# Patient Record
Sex: Female | Born: 1960 | Race: White | Hispanic: No | Marital: Married | Smoking: Former smoker
Health system: Southern US, Community
[De-identification: ages and names within clinical notes are randomized; demographics above are authoritative.]

## PROBLEM LIST (undated history)

## (undated) DIAGNOSIS — G43909 Migraine, unspecified, not intractable, without status migrainosus: Secondary | ICD-10-CM

## (undated) DIAGNOSIS — Z9889 Other specified postprocedural states: Secondary | ICD-10-CM

## (undated) DIAGNOSIS — E7800 Pure hypercholesterolemia, unspecified: Secondary | ICD-10-CM

## (undated) DIAGNOSIS — IMO0002 Reserved for concepts with insufficient information to code with codable children: Secondary | ICD-10-CM

## (undated) DIAGNOSIS — N6092 Unspecified benign mammary dysplasia of left breast: Secondary | ICD-10-CM

## (undated) DIAGNOSIS — R112 Nausea with vomiting, unspecified: Secondary | ICD-10-CM

## (undated) DIAGNOSIS — Z8619 Personal history of other infectious and parasitic diseases: Secondary | ICD-10-CM

## (undated) DIAGNOSIS — D4860 Neoplasm of uncertain behavior of unspecified breast: Secondary | ICD-10-CM

## (undated) DIAGNOSIS — J45909 Unspecified asthma, uncomplicated: Secondary | ICD-10-CM

## (undated) HISTORY — DX: Migraine, unspecified, not intractable, without status migrainosus: G43.909

## (undated) HISTORY — DX: Personal history of other infectious and parasitic diseases: Z86.19

## (undated) HISTORY — DX: Unspecified asthma, uncomplicated: J45.909

## (undated) HISTORY — DX: Reserved for concepts with insufficient information to code with codable children: IMO0002

## (undated) HISTORY — DX: Neoplasm of uncertain behavior of unspecified breast: D48.60

## (undated) HISTORY — DX: Unspecified benign mammary dysplasia of left breast: N60.92

## (undated) HISTORY — DX: Pure hypercholesterolemia, unspecified: E78.00

---
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
## 1981-07-12 DIAGNOSIS — J45909 Unspecified asthma, uncomplicated: Secondary | ICD-10-CM

## 1981-07-12 HISTORY — DX: Unspecified asthma, uncomplicated: J45.909

## 1997-12-02 ENCOUNTER — Other Ambulatory Visit: Admission: RE | Admit: 1997-12-02 | Discharge: 1997-12-02 | Payer: Self-pay | Admitting: Obstetrics and Gynecology

## 1998-05-26 ENCOUNTER — Inpatient Hospital Stay (HOSPITAL_COMMUNITY): Admission: AD | Admit: 1998-05-26 | Discharge: 1998-05-26 | Payer: Self-pay | Admitting: Obstetrics and Gynecology

## 1998-05-27 ENCOUNTER — Inpatient Hospital Stay (HOSPITAL_COMMUNITY): Admission: AD | Admit: 1998-05-27 | Discharge: 1998-05-30 | Payer: Self-pay | Admitting: Obstetrics and Gynecology

## 1998-06-16 ENCOUNTER — Inpatient Hospital Stay (HOSPITAL_COMMUNITY): Admission: AD | Admit: 1998-06-16 | Discharge: 1998-06-16 | Payer: Self-pay | Admitting: Obstetrics and Gynecology

## 1998-06-24 ENCOUNTER — Inpatient Hospital Stay (HOSPITAL_COMMUNITY): Admission: AD | Admit: 1998-06-24 | Discharge: 1998-06-26 | Payer: Self-pay | Admitting: Obstetrics and Gynecology

## 1998-07-31 ENCOUNTER — Other Ambulatory Visit: Admission: RE | Admit: 1998-07-31 | Discharge: 1998-07-31 | Payer: Self-pay | Admitting: Obstetrics and Gynecology

## 1999-07-13 HISTORY — PX: GANGLION CYST EXCISION: SHX1691

## 2000-01-29 ENCOUNTER — Other Ambulatory Visit: Admission: RE | Admit: 2000-01-29 | Discharge: 2000-01-29 | Payer: Self-pay | Admitting: Obstetrics and Gynecology

## 2001-05-25 ENCOUNTER — Other Ambulatory Visit: Admission: RE | Admit: 2001-05-25 | Discharge: 2001-05-25 | Payer: Self-pay | Admitting: Obstetrics and Gynecology

## 2007-06-19 ENCOUNTER — Ambulatory Visit: Payer: Self-pay | Admitting: Specialist

## 2009-07-12 HISTORY — PX: DILATION AND CURETTAGE OF UTERUS: SHX78

## 2010-01-19 ENCOUNTER — Ambulatory Visit: Payer: Self-pay | Admitting: Unknown Physician Specialty

## 2010-01-20 ENCOUNTER — Ambulatory Visit: Payer: Self-pay | Admitting: Unknown Physician Specialty

## 2010-01-22 LAB — PATHOLOGY REPORT

## 2010-07-12 DIAGNOSIS — N6092 Unspecified benign mammary dysplasia of left breast: Secondary | ICD-10-CM

## 2010-07-12 HISTORY — DX: Unspecified benign mammary dysplasia of left breast: N60.92

## 2010-07-12 HISTORY — PX: BREAST MASS EXCISION: SHX1267

## 2010-07-20 ENCOUNTER — Ambulatory Visit: Payer: Self-pay | Admitting: General Surgery

## 2010-07-21 LAB — PATHOLOGY REPORT

## 2011-02-15 ENCOUNTER — Ambulatory Visit: Payer: Self-pay | Admitting: General Surgery

## 2011-02-18 LAB — PATHOLOGY REPORT

## 2011-07-13 DIAGNOSIS — D4860 Neoplasm of uncertain behavior of unspecified breast: Secondary | ICD-10-CM

## 2011-07-13 HISTORY — DX: Neoplasm of uncertain behavior of unspecified breast: D48.60

## 2012-07-12 HISTORY — PX: COLONOSCOPY: SHX174

## 2012-09-21 ENCOUNTER — Encounter: Payer: Self-pay | Admitting: *Deleted

## 2012-10-10 ENCOUNTER — Encounter: Payer: Self-pay | Admitting: General Surgery

## 2012-12-05 ENCOUNTER — Encounter: Payer: Self-pay | Admitting: General Surgery

## 2012-12-06 ENCOUNTER — Ambulatory Visit (INDEPENDENT_AMBULATORY_CARE_PROVIDER_SITE_OTHER): Payer: BC Managed Care – PPO | Admitting: General Surgery

## 2012-12-06 ENCOUNTER — Other Ambulatory Visit: Payer: Self-pay | Admitting: *Deleted

## 2012-12-06 ENCOUNTER — Encounter: Payer: Self-pay | Admitting: General Surgery

## 2012-12-06 VITALS — BP 120/74 | HR 76 | Resp 14 | Ht 63.0 in | Wt 141.0 lb

## 2012-12-06 DIAGNOSIS — D4860 Neoplasm of uncertain behavior of unspecified breast: Secondary | ICD-10-CM

## 2012-12-06 DIAGNOSIS — Z1231 Encounter for screening mammogram for malignant neoplasm of breast: Secondary | ICD-10-CM

## 2012-12-06 DIAGNOSIS — D4862 Neoplasm of uncertain behavior of left breast: Secondary | ICD-10-CM

## 2012-12-06 DIAGNOSIS — Z803 Family history of malignant neoplasm of breast: Secondary | ICD-10-CM | POA: Insufficient documentation

## 2012-12-06 NOTE — Progress Notes (Signed)
Patient will be asked to return to the office in one year for a bilateral screening mammogram. 

## 2012-12-06 NOTE — Progress Notes (Signed)
Patient ID: Abigail Lopez, female   DOB: 1960-11-10, 52 y.o.   MRN: [id number]  No chief complaint on file.   HPI Abigail Lopez is a 52 y.o. female who presents for a follow up mammogram. The most recent mammogram was done on 11/22/12 with birad category 2. The patient admits regular self breast checks and gets regular mammograms done. The patient's mother had a history of breast cancer diagnosed at the age 57. She has had a left breast mass excision done in 2012 - showing ALH. She states no problems at this time with her breasts. Patient also had a screening colonoscopy at [facility] and 2-3 polyps were removed this is per patient.  HPI  Past Medical History  Diagnosis Date  . Neoplasm of uncertain behavior of breast 2013  . Asthma 1983  . DDD (degenerative disc disease)     Past Surgical History  Procedure Laterality Date  . Dilation and curettage of uterus  2011  . Breast mass excision Left 2012  . Knee surgery Left 1978  . Ganglion cyst excision  2001  . Cesarean section  1995    Family History  Problem Relation Age of Onset  . Cancer Mother 91    breast     Social History History  Substance Use Topics  . Smoking status: Never Smoker   . Smokeless tobacco: Never Used  . Alcohol Use: No    Allergies  Allergen Reactions  . Atarax (Hydroxyzine) Hives and Itching  . Tandearil (Oxyphenbutazone) Hives and Itching    No current outpatient prescriptions on file.   No current facility-administered medications for this visit.    Review of Systems Review of Systems  Constitutional: Negative.   Respiratory: Negative.   Cardiovascular: Negative.     Blood pressure 120/74, pulse 76, resp. rate 14, height 5\' 3"  (1.6 m), weight 141 lb (63.957 kg), last menstrual period 12/04/2012.  Physical Exam Physical Exam  Constitutional: She is oriented to person, place, and time. She appears well-developed and well-nourished.  Eyes: Conjunctivae are normal. No scleral  icterus.  Neck: Trachea normal. No mass and no thyromegaly present.  Cardiovascular: Normal rate, regular rhythm, normal heart sounds and normal pulses.   No murmur heard. Pulses:      Dorsalis pedis pulses are 2+ on the right side, and 2+ on the left side.       Posterior tibial pulses are 2+ on the right side, and 2+ on the left side.  No leg edema or varicose veins.   Pulmonary/Chest: Effort normal and breath sounds normal. Right breast exhibits no inverted nipple, no mass, no nipple discharge, no skin change and no tenderness. Left breast exhibits no inverted nipple, no mass, no nipple discharge, no skin change and no tenderness. Breasts are symmetrical.  Abdominal: Soft. Normal appearance and bowel sounds are normal. There is no hepatosplenomegaly. There is no tenderness. No hernia.  Lymphadenopathy:    She has no cervical adenopathy.    She has no axillary adenopathy.  Neurological: She is alert and oriented to person, place, and time.  Skin: Skin is warm and dry.    Data Reviewed  Mammogram reviewed.   Assessment    Stable Exam.  High risk due to [facility] and family history.     Plan    Yearly follow up. Patient not inclined to have genetic testing at this time.         Abigail Lopez 12/07/2012, 9:38 AM

## 2012-12-06 NOTE — Patient Instructions (Addendum)
Patient to return in 1 year with bilateral screening mammogram. 

## 2012-12-07 ENCOUNTER — Encounter: Payer: Self-pay | Admitting: General Surgery

## 2013-02-07 LAB — HM PAP SMEAR: HM Pap smear: NEGATIVE

## 2013-12-06 ENCOUNTER — Ambulatory Visit: Payer: BC Managed Care – PPO | Admitting: General Surgery

## 2014-03-27 ENCOUNTER — Ambulatory Visit: Payer: BC Managed Care – PPO | Admitting: General Surgery

## 2014-04-25 ENCOUNTER — Other Ambulatory Visit: Payer: Self-pay | Admitting: Physical Medicine and Rehabilitation

## 2014-04-25 DIAGNOSIS — M5010 Cervical disc disorder with radiculopathy, unspecified cervical region: Secondary | ICD-10-CM

## 2014-04-29 ENCOUNTER — Other Ambulatory Visit: Payer: Self-pay | Admitting: Nurse Practitioner

## 2014-04-29 DIAGNOSIS — M542 Cervicalgia: Secondary | ICD-10-CM

## 2014-04-29 DIAGNOSIS — M545 Low back pain, unspecified: Secondary | ICD-10-CM

## 2014-05-04 ENCOUNTER — Ambulatory Visit
Admission: RE | Admit: 2014-05-04 | Discharge: 2014-05-04 | Disposition: A | Discharge status: Home / Self Care | Payer: BC Managed Care – PPO | Source: Ambulatory Visit | Attending: Nurse Practitioner | Admitting: Nurse Practitioner

## 2014-05-04 DIAGNOSIS — M542 Cervicalgia: Secondary | ICD-10-CM

## 2014-05-04 DIAGNOSIS — M545 Low back pain, unspecified: Secondary | ICD-10-CM

## 2014-05-08 ENCOUNTER — Encounter: Payer: Self-pay | Admitting: *Deleted

## 2014-05-13 ENCOUNTER — Encounter: Payer: Self-pay | Admitting: General Surgery

## 2015-01-21 ENCOUNTER — Other Ambulatory Visit: Payer: Self-pay | Admitting: Family Medicine

## 2015-01-21 DIAGNOSIS — Z1231 Encounter for screening mammogram for malignant neoplasm of breast: Secondary | ICD-10-CM

## 2015-01-21 LAB — HM PAP SMEAR: HM Pap smear: NEGATIVE

## 2015-02-12 ENCOUNTER — Encounter: Payer: Self-pay | Admitting: General Surgery

## 2015-02-12 ENCOUNTER — Ambulatory Visit (INDEPENDENT_AMBULATORY_CARE_PROVIDER_SITE_OTHER): Payer: BLUE CROSS/BLUE SHIELD | Admitting: General Surgery

## 2015-02-12 VITALS — BP 112/72 | HR 70 | Resp 12 | Ht 63.0 in | Wt 142.0 lb

## 2015-02-12 DIAGNOSIS — N644 Mastodynia: Secondary | ICD-10-CM | POA: Diagnosis not present

## 2015-02-12 DIAGNOSIS — Z803 Family history of malignant neoplasm of breast: Secondary | ICD-10-CM

## 2015-02-12 DIAGNOSIS — N62 Hypertrophy of breast: Secondary | ICD-10-CM

## 2015-02-12 DIAGNOSIS — N6092 Unspecified benign mammary dysplasia of left breast: Secondary | ICD-10-CM

## 2015-02-12 NOTE — Progress Notes (Signed)
Patient ID: Abigail Lopez, female   DOB: 09/18/1960, 54 y.o.   MRN: [id number]  Chief Complaint  Patient presents with  . Other    mammogram    HPI Abigail Lopez is a 54 y.o. female who presents for a breast evaluation. The most recent mammogram was done on 02/05/15. She states she was having left breast pain, manifested by tenderness with palpation which has now resolved.  Patient was on birth control pills for menstrual irregularity at the time her breast pain developed and as soon as she stop taking them her breast pain got better.  Patient does perform regular self breast checks and gets regular mammograms done.   Mother had breast cancer at the age 54.  HPI  Past Medical History  Diagnosis Date  . Neoplasm of uncertain behavior of breast 2013  . Asthma 1983  . DDD (degenerative disc disease)   . 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  Marland Kitchen Atypical lobular hyperplasia of left breast 2012    Present on stereo biopsy, no additional pathology on wide excision.    Past Surgical History  Procedure Laterality Date  . Dilation and curettage of uterus  2011  . Breast mass excision Left 2012  . Knee surgery Left 1978  . Ganglion cyst excision  2001  . Cesarean section  1995  . [facility]  [facility]    Family History  Problem Relation Age of Onset  . Cancer Mother 77    breast   . Liver cancer Mother 75  . Ovarian cancer Paternal Grandmother   . Colon cancer Father     Social History History  Substance Use Topics  . Smoking status: Former Smoker -- 1.00 packs/day for 4 years    Types: Cigarettes  . Smokeless tobacco: Never Used  . Alcohol Use: No    Allergies  Allergen Reactions  . Atarax [Hydroxyzine] Hives and Itching  . Tandearil [Oxyphenbutazone] Hives and Itching    Current Outpatient Prescriptions  Medication Sig Dispense Refill  .  cetirizine (ZYRTEC) 10 MG tablet Take 10 mg by mouth as needed for allergies.    . cyclobenzaprine (FLEXERIL) 5 MG tablet Take 5 mg by mouth as needed for muscle spasms.    . naproxen sodium (ANAPROX) 220 MG tablet Take 220 mg by mouth as needed.     No current facility-administered medications for this visit.    Review of Systems Review of Systems  Constitutional: Negative.   Respiratory: Negative.   Cardiovascular: Negative.     Blood pressure 112/72, pulse 70, resp. rate 12, height 5\' 3"  (1.6 m), weight 142 lb (64.411 kg), last menstrual period 02/07/2015.  Physical Exam Physical Exam  Constitutional: She is oriented to person, place, and time. She appears well-developed and well-nourished.  HENT:  Mouth/Throat: Oropharynx is clear and moist. No oropharyngeal exudate.  Eyes: Conjunctivae are normal. No scleral icterus.  Neck: Neck supple.  Cardiovascular: Normal rate, regular rhythm and normal heart sounds.   Pulmonary/Chest: Effort normal and breath sounds normal. Right breast exhibits no inverted nipple, no mass, no nipple discharge, no skin change and no tenderness. Left breast exhibits no inverted nipple, no mass, no nipple discharge, no skin change and no tenderness. Breasts are asymmetrical (left breast one cup size bigger than right breast).  Left breast well healed scar.  Lymphadenopathy:    She has no cervical  adenopathy.    She has no axillary adenopathy.  Neurological: She is alert and oriented to person, place, and time.  Skin: Skin is warm and dry.    Data Reviewed Abigail Lopez model risk assessment: 6.4% over 5 years, 40.3% lifetime. Bilateral screening mammograms dated 02/04/2015 completed at [facility] were reviewed. BI-RADS-1. Heterogeneously dense breast tissue reported.  Assessment    Benign breast exam and mammograms.  Increased risk for breast cancer based on Gail model assessment.    Plan    The pros and cons of chemoprevention were discussed. The  patient will consider her options and notify the office if she would like to proceed. She is presently under evaluation for dysfunctional uterine bleeding, and likely any decision would be postponed until after that issue is resolved with Dr. Ammie Dalton.   Discussed Tamoxifen with patient. Patient to return as needed.   PCP:  Virk, Charanjit Reg. Dr. Percell Boston, [place] Gleason 02/13/2015, 7:19 AM

## 2015-02-12 NOTE — Patient Instructions (Addendum)
Patient to return as needed. Continue self breast exams. Call office for any new breast issues or concerns.'  

## 2015-02-13 ENCOUNTER — Encounter: Payer: Self-pay | Admitting: General Surgery

## 2015-02-13 DIAGNOSIS — N6092 Unspecified benign mammary dysplasia of left breast: Secondary | ICD-10-CM | POA: Insufficient documentation

## 2015-03-10 ENCOUNTER — Other Ambulatory Visit: Payer: Self-pay | Admitting: General Surgery

## 2016-02-29 ENCOUNTER — Emergency Department
Admission: EM | Admit: 2016-02-29 | Discharge: 2016-02-29 | Disposition: A | Discharge status: Home / Self Care | Payer: Self-pay | Attending: Emergency Medicine | Admitting: Emergency Medicine

## 2016-02-29 DIAGNOSIS — R21 Rash and other nonspecific skin eruption: Secondary | ICD-10-CM | POA: Insufficient documentation

## 2016-02-29 DIAGNOSIS — J45909 Unspecified asthma, uncomplicated: Secondary | ICD-10-CM | POA: Insufficient documentation

## 2016-02-29 DIAGNOSIS — Z5321 Procedure and treatment not carried out due to patient leaving prior to being seen by health care provider: Secondary | ICD-10-CM | POA: Insufficient documentation

## 2016-02-29 DIAGNOSIS — Z87891 Personal history of nicotine dependence: Secondary | ICD-10-CM | POA: Insufficient documentation

## 2016-02-29 DIAGNOSIS — Z853 Personal history of malignant neoplasm of breast: Secondary | ICD-10-CM | POA: Insufficient documentation

## 2016-02-29 MED ORDER — DIPHENHYDRAMINE HCL 25 MG PO CAPS: 25.0000 mg | ORAL_CAPSULE | Freq: Once | ORAL | Status: DC

## 2016-02-29 NOTE — ED Notes (Addendum)
After obtaining order for more Benadryl from Dr Beather Arbour, pt says she starts getting an itchy nose and ears after taking that; pt says already itching for pill she took before coming in; pt hesitant to take another Benadryl; order discontinued per Dr Beather Arbour

## 2016-02-29 NOTE — ED Triage Notes (Signed)
Pt reports itching rash to entire body; woke at 2am with same; denies difficulty breathing; too 25mg  Benadryl pta

## 2016-05-02 ENCOUNTER — Emergency Department: Payer: Self-pay

## 2016-05-02 ENCOUNTER — Emergency Department
Admission: EM | Admit: 2016-05-02 | Discharge: 2016-05-02 | Disposition: A | Discharge status: Home / Self Care | Payer: Self-pay | Attending: Emergency Medicine | Admitting: Emergency Medicine

## 2016-05-02 ENCOUNTER — Encounter: Payer: Self-pay | Admitting: Emergency Medicine

## 2016-05-02 DIAGNOSIS — M793 Panniculitis, unspecified: Secondary | ICD-10-CM

## 2016-05-02 DIAGNOSIS — Z79899 Other long term (current) drug therapy: Secondary | ICD-10-CM | POA: Insufficient documentation

## 2016-05-02 DIAGNOSIS — J45909 Unspecified asthma, uncomplicated: Secondary | ICD-10-CM | POA: Insufficient documentation

## 2016-05-02 DIAGNOSIS — Z87891 Personal history of nicotine dependence: Secondary | ICD-10-CM | POA: Insufficient documentation

## 2016-05-02 DIAGNOSIS — R1031 Right lower quadrant pain: Secondary | ICD-10-CM

## 2016-05-02 DIAGNOSIS — Z791 Long term (current) use of non-steroidal anti-inflammatories (NSAID): Secondary | ICD-10-CM | POA: Insufficient documentation

## 2016-05-02 DIAGNOSIS — Z853 Personal history of malignant neoplasm of breast: Secondary | ICD-10-CM | POA: Insufficient documentation

## 2016-05-02 LAB — URINALYSIS COMPLETE WITH MICROSCOPIC (ARMC ONLY)
Bacteria, UA: NONE SEEN
Bilirubin Urine: NEGATIVE
Glucose, UA: NEGATIVE mg/dL
Hgb urine dipstick: NEGATIVE
Ketones, ur: NEGATIVE mg/dL
Leukocytes, UA: NEGATIVE
Nitrite: NEGATIVE
Protein, ur: NEGATIVE mg/dL
Specific Gravity, Urine: 1.009 (ref 1.005–1.030)
pH: 7 (ref 5.0–8.0)

## 2016-05-02 LAB — COMPREHENSIVE METABOLIC PANEL
ALT: 14 U/L (ref 14–54)
AST: 18 U/L (ref 15–41)
Albumin: 4.5 g/dL (ref 3.5–5.0)
Alkaline Phosphatase: 62 U/L (ref 38–126)
Anion gap: 7 (ref 5–15)
BUN: 7 mg/dL (ref 6–20)
CO2: 26 mmol/L (ref 22–32)
Calcium: 9.5 mg/dL (ref 8.9–10.3)
Chloride: 105 mmol/L (ref 101–111)
Creatinine, Ser: 0.53 mg/dL (ref 0.44–1.00)
GFR calc Af Amer: >60 mL/min (ref >60)
GFR calc non Af Amer: >60 mL/min (ref >60)
Glucose, Bld: 92 mg/dL (ref 65–99)
Potassium: 3.8 mmol/L (ref 3.5–5.1)
Sodium: 138 mmol/L (ref 135–145)
Total Bilirubin: 0.7 mg/dL (ref 0.3–1.2)
Total Protein: 7.6 g/dL (ref 6.5–8.1)

## 2016-05-02 LAB — CBC
HCT: 41 % (ref 35.0–47.0)
Hemoglobin: 14.2 g/dL (ref 12.0–16.0)
MCH: 29.1 pg (ref 26.0–34.0)
MCHC: 34.7 g/dL (ref 32.0–36.0)
MCV: 84 fL (ref 80.0–100.0)
Platelets: 278 10*3/uL (ref 150–440)
RBC: 4.88 MIL/uL (ref 3.80–5.20)
RDW: 13.6 % (ref 11.5–14.5)
WBC: 9.5 10*3/uL (ref 3.6–11.0)

## 2016-05-02 LAB — LIPASE, BLOOD: Lipase: 29 U/L (ref 11–51)

## 2016-05-02 MED ORDER — ONDANSETRON HCL 4 MG/2ML IJ SOLN
INTRAMUSCULAR | Status: AC
  Administered 2016-05-02: 4 mg via INTRAVENOUS
  Filled 2016-05-02: qty 2

## 2016-05-02 MED ORDER — HYDROMORPHONE HCL 1 MG/ML IJ SOLN
INTRAMUSCULAR | Status: AC
  Administered 2016-05-02: 0.5 mg via INTRAVENOUS
  Filled 2016-05-02: qty 1

## 2016-05-02 MED ORDER — ONDANSETRON HCL 4 MG/2ML IJ SOLN
4.0000 mg | Freq: Once | INTRAMUSCULAR | Status: AC
  Administered 2016-05-02: 4 mg via INTRAVENOUS

## 2016-05-02 MED ORDER — IOPAMIDOL (ISOVUE-300) INJECTION 61%
100.0000 mL | Freq: Once | INTRAVENOUS | Status: AC | PRN
  Administered 2016-05-02: 100 mL via INTRAVENOUS
  Filled 2016-05-02: qty 100

## 2016-05-02 MED ORDER — METHYLPREDNISOLONE SODIUM SUCC 125 MG IJ SOLR
INTRAMUSCULAR | Status: AC
  Administered 2016-05-02: 125 mg via INTRAVENOUS
  Filled 2016-05-02: qty 2

## 2016-05-02 MED ORDER — IOPAMIDOL (ISOVUE-300) INJECTION 61%
30.0000 mL | Freq: Once | INTRAVENOUS | Status: AC | PRN
  Administered 2016-05-02: 30 mL via ORAL
  Filled 2016-05-02: qty 30

## 2016-05-02 MED ORDER — HYDROMORPHONE HCL 2 MG PO TABS: 1.0000 mg | ORAL_TABLET | Freq: Four times a day (QID) | ORAL | 0 refills | Status: DC | PRN

## 2016-05-02 MED ORDER — METHYLPREDNISOLONE SODIUM SUCC 125 MG IJ SOLR
125.0000 mg | Freq: Once | INTRAMUSCULAR | Status: AC
  Administered 2016-05-02: 125 mg via INTRAVENOUS
  Filled 2016-05-02: qty 2

## 2016-05-02 MED ORDER — HYDROMORPHONE HCL 1 MG/ML IJ SOLN
0.5000 mg | Freq: Once | INTRAMUSCULAR | Status: AC
Start: 2016-05-02 — End: 2016-05-02
  Administered 2016-05-02: 0.5 mg via INTRAVENOUS

## 2016-05-02 NOTE — ED Notes (Signed)
Attempted IV start x2. No access obtained. Notifying Caryl Pina, RN for attempt. Pt has been experiencing nausea with no vomiting x2 days. Pt has had off and on pains of right lower abdomen x2 months with increase in pain the last 2 days. Pt denying any changes in BM . Pt in NAD at this time. Pt stating constant ache in abdomen with pain that shoots down right upper leg.

## 2016-05-02 NOTE — ED Triage Notes (Signed)
1st RN: Patient arrives to [facility] ED via [facility]. NAD Noted

## 2016-05-02 NOTE — ED Notes (Signed)
Ct notified patient completed contrast.

## 2016-05-02 NOTE — ED Provider Notes (Signed)
[facility] Emergency Department Provider Note        Time seen: ----------------------------------------- 3:52 PM on 05/02/2016 -----------------------------------------    I have reviewed the triage vital signs and the nursing notes.   HISTORY  Chief Complaint Abdominal Pain    HPI Abigail Lopez is a 55 y.o. female who presents to ER for right-sided abdominal pain that initially was intermittent. Currently the pain has been persistent since Thursday. It's only in the right lower quadrant, occasionally she had pain radiating into her right leg or in her right low back. She denies fevers or chills, denies vomiting or diarrhea. She's never had pain like this prior to the past 2 months.   Past Medical History:  Diagnosis Date  . Asthma 1983  . 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  Marland Kitchen Atypical lobular hyperplasia of left breast 2012   Present on stereo biopsy, no additional pathology on wide excision.  . DDD (degenerative disc disease)   . Neoplasm of uncertain behavior of breast 2013    Patient Active Problem List   Diagnosis Date Noted  . Family history of breast cancer 12/06/2012  . Neoplasm of uncertain behavior of breast 12/06/2012    Past Surgical History:  Procedure Laterality Date  . BREAST MASS EXCISION Left 2012  . CESAREAN SECTION  1995  . [facility]  [facility]   [facility]  . DILATION AND CURETTAGE OF UTERUS  2011  . GANGLION CYST EXCISION  2001  . KNEE SURGERY Left 1978    Allergies Atarax [hydroxyzine]; Hydrocodone; Oxycodone; Tandearil [oxyphenbutazone]; and Benadryl [diphenhydramine]  Social History Social History  Substance Use Topics  . Smoking status: Former Smoker    Packs/day: 1.00    Years: 4.00    Types: Cigarettes  . Smokeless tobacco: Never Used  . Alcohol use No    Review of Systems Constitutional:  Negative for fever. Cardiovascular: Negative for chest pain. Respiratory: Negative for shortness of breath. Gastrointestinal:Positive for abdominal pain, nausea Genitourinary: Negative for dysuria. Musculoskeletal: Negative for back pain. Skin: Negative for rash. Neurological: Negative for headaches, focal weakness or numbness.  10-point ROS otherwise negative.  ____________________________________________   PHYSICAL EXAM:  VITAL SIGNS: ED Triage Vitals  Enc Vitals Group     BP 05/02/16 1431 (!) 175/77     Pulse Rate 05/02/16 1431 68     Resp 05/02/16 1431 14     Temp 05/02/16 1431 98.3 F (36.8 C)     Temp Source 05/02/16 1431 Oral     SpO2 05/02/16 1431 98 %     Weight 05/02/16 1431 145 lb (65.8 kg)     Height 05/02/16 1431 5\' 3"  (1.6 m)     Head Circumference --      Peak Flow --      Pain Score 05/02/16 1440 5     Pain Loc --      Pain Edu? --      Excl. in [place]? --     Constitutional: Alert and oriented. Well appearing and in no distress. Eyes: Conjunctivae are normal. PERRL. Normal extraocular movements. ENT   Head: Normocephalic and atraumatic.   Nose: No congestion/rhinnorhea.   Mouth/Throat: Mucous membranes are moist.   Neck: No stridor. Cardiovascular: Normal rate, regular rhythm. No murmurs, rubs, or gallops. Respiratory: Normal respiratory effort without tachypnea nor retractions. Breath sounds are clear and equal bilaterally. No wheezes/rales/rhonchi. Gastrointestinal: Right lower  quadrant tenderness, no rebound or guarding. Normal bowel sounds. Musculoskeletal: Nontender with normal range of motion in all extremities. No lower extremity tenderness nor edema. Neurologic:  Normal speech and language. No gross focal neurologic deficits are appreciated.  Skin:  Skin is warm, dry and intact. No rash noted. Psychiatric: Mood and affect are normal. Speech and behavior are normal.  ____________________________________________  ED  COURSE:  Pertinent labs & imaging results that were available during my care of the patient were reviewed by me and considered in my medical decision making (see chart for details). Clinical Course  Patient presents to the ER in no distress, uncertain etiology for her pain. We will assess with basic labs and likely CT imaging.  Procedures ____________________________________________   LABS (pertinent positives/negatives)  Labs Reviewed  URINALYSIS COMPLETEWITH MICROSCOPIC ([facility] ONLY) - Abnormal; Notable for the following:       Result Value   Color, Urine YELLOW (*)    APPearance CLEAR (*)    Squamous Epithelial / LPF 0-5 (*)    All other components within normal limits  LIPASE, BLOOD  COMPREHENSIVE METABOLIC PANEL  CBC    RADIOLOGY Images were viewed by me  CT of the abdomen and pelvis with contrast MPRESSION: 1. Normal appendix. 2. Unusual appearance of the ileal small bowel mesentery which contains numerous prominent but nonenlarged lymph nodes, as well as extensive soft tissue stranding suggesting acute inflammation. This likely reflects an acute panniculitis given the patient's symptoms and lack of other acute findings. No corresponding areas of bowel wall thickening or surrounding inflammatory changes associated with the small bowel or colon to suggest an enteritis or colitis at this time. 3. Aortic atherosclerosis. 4. Fibroid uterus. ____________________________________________  FINAL ASSESSMENT AND PLAN  Abdominal pain, panniculitis  Plan: Patient with labs and imaging as dictated above. Patient with panniculitis of uncertain etiology. She'll be discharged with pain medicine and referred to gastroenterology for outpatient follow-up.   Earleen Newport, MD   Note: This dictation was prepared with Dragon dictation. Any transcriptional errors that result from this process are unintentional    Earleen Newport, MD 05/02/16 [phone]

## 2016-05-02 NOTE — ED Triage Notes (Signed)
C/O right abdominal pain.  Reports intermittent pain to right abdomen for several months.  Thursday pain worsened.  C/O nausea.  Pain constant to RLQ and radiates down right leg and toward right lower back.

## 2016-05-02 NOTE — ED Notes (Signed)
Pt in CT.

## 2017-02-03 ENCOUNTER — Encounter (INDEPENDENT_AMBULATORY_CARE_PROVIDER_SITE_OTHER): Payer: Self-pay

## 2017-02-03 ENCOUNTER — Encounter: Payer: Self-pay | Admitting: Family Medicine

## 2017-02-03 ENCOUNTER — Ambulatory Visit (INDEPENDENT_AMBULATORY_CARE_PROVIDER_SITE_OTHER): Payer: Self-pay | Admitting: Family Medicine

## 2017-02-03 ENCOUNTER — Encounter: Payer: Self-pay | Admitting: *Deleted

## 2017-02-03 VITALS — BP 120/76 | HR 70 | Temp 98.4°F | Ht 63.0 in | Wt 143.5 lb

## 2017-02-03 DIAGNOSIS — E7800 Pure hypercholesterolemia, unspecified: Secondary | ICD-10-CM

## 2017-02-03 DIAGNOSIS — R0789 Other chest pain: Secondary | ICD-10-CM

## 2017-02-03 DIAGNOSIS — Z13220 Encounter for screening for lipoid disorders: Secondary | ICD-10-CM

## 2017-02-03 DIAGNOSIS — Z1329 Encounter for screening for other suspected endocrine disorder: Secondary | ICD-10-CM

## 2017-02-03 DIAGNOSIS — J4520 Mild intermittent asthma, uncomplicated: Secondary | ICD-10-CM | POA: Insufficient documentation

## 2017-02-03 DIAGNOSIS — M5020 Other cervical disc displacement, unspecified cervical region: Secondary | ICD-10-CM

## 2017-02-03 DIAGNOSIS — Z1159 Encounter for screening for other viral diseases: Secondary | ICD-10-CM

## 2017-02-03 DIAGNOSIS — R21 Rash and other nonspecific skin eruption: Secondary | ICD-10-CM

## 2017-02-03 DIAGNOSIS — G43829 Menstrual migraine, not intractable, without status migrainosus: Secondary | ICD-10-CM

## 2017-02-03 DIAGNOSIS — Z800 Family history of malignant neoplasm of digestive organs: Secondary | ICD-10-CM

## 2017-02-03 LAB — COMPREHENSIVE METABOLIC PANEL WITH GFR
ALT: 12 U/L (ref 0–35)
AST: 14 U/L (ref 0–37)
Albumin: 4.5 g/dL (ref 3.5–5.2)
Alkaline Phosphatase: 55 U/L (ref 39–117)
BUN: 5 mg/dL — ABNORMAL LOW (ref 6–23)
CO2: 30 meq/L (ref 19–32)
Calcium: 9.8 mg/dL (ref 8.4–10.5)
Chloride: 104 meq/L (ref 96–112)
Creatinine, Ser: 0.65 mg/dL (ref 0.40–1.20)
GFR: 100.28 mL/min (ref >60.00)
Glucose, Bld: 96 mg/dL (ref 70–99)
Potassium: 4.7 meq/L (ref 3.5–5.1)
Sodium: 140 meq/L (ref 135–145)
Total Bilirubin: 0.7 mg/dL (ref 0.2–1.2)
Total Protein: 7.3 g/dL (ref 6.0–8.3)

## 2017-02-03 LAB — LIPID PANEL
Cholesterol: 211 mg/dL — ABNORMAL HIGH (ref 0–200)
HDL: 48.6 mg/dL (ref >39.00)
LDL Cholesterol: 131 mg/dL — ABNORMAL HIGH (ref 0–99)
NonHDL: 162.42
Total CHOL/HDL Ratio: 4
Triglycerides: 159 mg/dL — ABNORMAL HIGH (ref 0.0–149.0)
VLDL: 31.8 mg/dL (ref 0.0–40.0)

## 2017-02-03 LAB — TSH: TSH: 1.94 u[IU]/mL (ref 0.35–4.50)

## 2017-02-03 MED ORDER — CLOTRIMAZOLE-BETAMETHASONE 1-0.05 % EX CREA: 1.0000 "application " | TOPICAL_CREAM | Freq: Two times a day (BID) | CUTANEOUS | 0 refills | Status: DC

## 2017-02-03 NOTE — Progress Notes (Signed)
Subjective:    Patient ID: Abigail Lopez, female    DOB: 10/05/1960, 56 y.o.   MRN: [id number]  HPI   56 year old female presents to establish care.  Prior PCP  Dr. Kandice Robinsons. At [facility]:  2017.. Due. Dr. Vernie Ammons at [facility] side, retired  Last pelvic exam:  2017 At that time she had a sonohystogram given she still having menses. Now occuring less frequently.. About every 2-3 month, lighter.  headaches have improved with starting menopause, likely hormonal.  Hx of breast biopsy.. Negative except for calcifications.  Mother with family history of breast   Has not done mammogram in several years.  High cholesterol.. Improved with  Diet at last check in 02/03/2015 total 216, LDL  She has been under  A lot of stress. Father passed away in 2016/09/08  Son in substance abuse treatmentcancer.  She wishes to discss atypical chest pain.. See ROS for details  Has noted itchy rash in right groin, off and on.  Dollar coin size, picnk, sweaty.  no blister, no pustules   Use cortisone OTc.Marland Kitchen Helps some.  History of tick bites in last 3 years  Diet: high fiber  To avoid constipation Exercise: walking daily 30 min  Social History /Family History/Past Medical History reviewed in detail and updated in EMR if needed. Blood pressure 120/76, pulse 70, temperature 98.4 F (36.9 C), temperature source Oral, height 5\' 3"  (1.6 m), weight 143 lb 8 oz (65.1 kg).  Review of Systems  Respiratory: Negative for shortness of breath and wheezing.   Cardiovascular: Positive for chest pain. Negative for palpitations and leg swelling.       Occ central chest pain radiated to right neck.. Nonexertional, random, pulse nml and BP only slightly elevated. 4 episodes since March  no relationship with eating, not reproducible Has tried vinegar and baking  Soda.Marland Kitchen Helps some.  Gastrointestinal: Negative for abdominal pain, blood in stool, constipation, diarrhea and nausea.       Objective:   Physical  Exam  Constitutional: Vital signs are normal. She appears well-developed and well-nourished. She is cooperative.  Non-toxic appearance. She does not appear ill. No distress.  HENT:  Head: Normocephalic.  Right Ear: Hearing, tympanic membrane, external ear and ear canal normal. Tympanic membrane is not erythematous, not retracted and not bulging.  Left Ear: Hearing, tympanic membrane, external ear and ear canal normal. Tympanic membrane is not erythematous, not retracted and not bulging.  Nose: No mucosal edema or rhinorrhea. Right sinus exhibits no maxillary sinus tenderness and no frontal sinus tenderness. Left sinus exhibits no maxillary sinus tenderness and no frontal sinus tenderness.  Mouth/Throat: Uvula is midline, oropharynx is clear and moist and mucous membranes are normal.  Eyes: Pupils are equal, round, and reactive to light. Conjunctivae, EOM and lids are normal. Lids are everted and swept, no foreign bodies found.  Neck: Trachea normal and normal range of motion. Neck supple. Carotid bruit is not present. No thyroid mass and no thyromegaly present.  Cardiovascular: Normal rate, regular rhythm, S1 normal, S2 normal, normal heart sounds, intact distal pulses and normal pulses.  Exam reveals no gallop and no friction rub.   No murmur heard. Pulmonary/Chest: Effort normal and breath sounds normal. No tachypnea. No respiratory distress. She has no decreased breath sounds. She has no wheezes. She has no rhonchi. She has no rales. She exhibits tenderness and bony tenderness.    Abdominal: Soft. Normal appearance and bowel sounds are normal. There  is no tenderness.  Neurological: She is alert.  Skin: Skin is warm, dry and intact. No rash noted.  Erythematous patch in left groin with leading edge.. Consistent with fungal infection.  Psychiatric: Her speech is normal and behavior is normal. Judgment and thought content normal. Her mood appears not anxious. Cognition and memory are normal. She  does not exhibit a depressed mood.          Assessment & Plan:

## 2017-02-03 NOTE — Assessment & Plan Note (Addendum)
Most consistent with fungal or yeast infeciton. treat with topical antifungal cream. Appears more like tinea than candida... Treat with an azole.

## 2017-02-03 NOTE — Assessment & Plan Note (Signed)
No clear caridiac source. Pt not interested in EKG today and I feel that is reasonable given description of pain and chest wall tenderness. Most likely costochondritis.. Treat with NSAIDs, heat and stretching of chest wall.

## 2017-02-03 NOTE — Assessment & Plan Note (Signed)
Due for re-eval. 

## 2017-02-03 NOTE — Patient Instructions (Addendum)
Please stop at the lab to have labs drawn.  Start chest wall stretching.  Use naprosyn twice daily x 5-7 days.  Apply cream to rash twice daily x 1-2 weeks, continue for 48 hours after rash appears.   Costochondritis Costochondritis is swelling and irritation (inflammation) of the tissue (cartilage) that connects your ribs to your breastbone (sternum). This causes pain in the front of your chest. The pain usually starts gradually and involves more than one rib. What are the causes? The exact cause of this condition is not always known. It results from stress on the cartilage where your ribs attach to your sternum. The cause of this stress could be:  Chest injury (trauma).  Exercise or activity, such as lifting.  Severe coughing.  What increases the risk? You may be at higher risk for this condition if you:  Are female.  Are [age] years old.  Recently started a new exercise or work activity.  Have low levels of vitamin D.  Have a condition that makes you cough frequently.  What are the signs or symptoms? The main symptom of this condition is chest pain. The pain:  Usually starts gradually and can be sharp or dull.  Gets worse with deep breathing, coughing, or exercise.  Gets better with rest.  May be worse when you press on the sternum-rib connection (tenderness).  How is this diagnosed? This condition is diagnosed based on your symptoms, medical history, and a physical exam. Your health care provider will check for tenderness when pressing on your sternum. This is the most important finding. You may also have tests to rule out other causes of chest pain. These may include:  A chest X-ray to check for lung problems.  An electrocardiogram (ECG) to see if you have a heart problem that could be causing the pain.  An imaging scan to rule out a chest or rib fracture.  How is this treated? This condition usually goes away on its own over time. Your health care provider  may prescribe an NSAID to reduce pain and inflammation. Your health care provider may also suggest that you:  Rest and avoid activities that make pain worse.  Apply heat or cold to the area to reduce pain and inflammation.  Do exercises to stretch your chest muscles.  If these treatments do not help, your health care provider may inject a numbing medicine at the sternum-rib connection to help relieve the pain. Follow these instructions at home:  Avoid activities that make pain worse. This includes any activities that use chest, abdominal, and side muscles.  If directed, put ice on the painful area: ? Put ice in a plastic bag. ? Place a towel between your skin and the bag. ? Leave the ice on for 20 minutes, 2-3 times a day.  If directed, apply heat to the affected area as often as told by your health care provider. Use the heat source that your health care provider recommends, such as a moist heat pack or a heating pad. ? Place a towel between your skin and the heat source. ? Leave the heat on for 20-30 minutes. ? Remove the heat if your skin turns bright red. This is especially important if you are unable to feel pain, heat, or cold. You may have a greater risk of getting burned.  Take over-the-counter and prescription medicines only as told by your health care provider.  Return to your normal activities as told by your health care provider. Ask your health care  provider what activities are safe for you.  Keep all follow-up visits as told by your health care provider. This is important. Contact a health care provider if:  You have chills or a fever.  Your pain does not go away or it gets worse.  You have a cough that does not go away (is persistent). Get help right away if:  You have shortness of breath. This information is not intended to replace advice given to you by your health care provider. Make sure you discuss any questions you have with your health care  provider. Document Released: 04/07/2005 Document Revised: 01/16/2016 Document Reviewed: 10/22/2015 Elsevier Interactive Patient Education  [employer].

## 2017-02-03 NOTE — Assessment & Plan Note (Signed)
Only triggered  by infection.

## 2017-02-03 NOTE — Assessment & Plan Note (Signed)
Occ causing headache, neck pain and tightness.  Uses stretches or flexeril prn.

## 2017-02-04 ENCOUNTER — Encounter: Payer: Self-pay | Admitting: *Deleted

## 2017-02-04 LAB — HEPATITIS C ANTIBODY: HCV Ab: NEGATIVE

## 2017-05-02 ENCOUNTER — Telehealth: Payer: Self-pay | Admitting: Family Medicine

## 2017-05-02 NOTE — Telephone Encounter (Signed)
Let pt know.. No repeat labs needed.

## 2017-05-02 NOTE — Telephone Encounter (Signed)
Abigail Lopez notified as instructed by telephone.

## 2017-05-02 NOTE — Telephone Encounter (Signed)
Copied from [place] #[id number]. Topic: Quick Communication - See Telephone Encounter >> May 02, 2017 10:30 AM Bea Graff, NT wrote: CRM for notification. See Telephone encounter for:  05/02/17. Pt has an appt on 05/06/17. She wants to know if there will be labs drawn during this visit or if the labs she had drawn in July will be used for this appt.

## 2017-05-06 ENCOUNTER — Ambulatory Visit (INDEPENDENT_AMBULATORY_CARE_PROVIDER_SITE_OTHER): Payer: Self-pay | Admitting: Family Medicine

## 2017-05-06 VITALS — BP 114/70 | HR 66 | Temp 97.9°F | Ht 62.75 in | Wt 138.8 lb

## 2017-05-06 DIAGNOSIS — Z1211 Encounter for screening for malignant neoplasm of colon: Secondary | ICD-10-CM

## 2017-05-06 DIAGNOSIS — Z800 Family history of malignant neoplasm of digestive organs: Secondary | ICD-10-CM

## 2017-05-06 DIAGNOSIS — Z0000 Encounter for general adult medical examination without abnormal findings: Secondary | ICD-10-CM

## 2017-05-06 DIAGNOSIS — Z23 Encounter for immunization: Secondary | ICD-10-CM

## 2017-05-06 NOTE — Addendum Note (Signed)
Addended by: Carter Kitten on: 05/06/2017 09:44 AM   Modules accepted: Orders

## 2017-05-06 NOTE — Progress Notes (Signed)
Subjective:    Patient ID: Abigail Lopez, female    DOB: July 03, 1961, 56 y.o.   MRN: [id number]  HPI  The patient is here for annual wellness exam and preventative care.     She is doing well overall.   He costochondritis and groin rash has resolved.   She has continued to lose weight. She is working on healthy lifestyle.  Body mass index is 24.77 kg/m.  15 lbs total. Wt Readings from Last 3 Encounters:  05/06/17 138 lb 12 oz (62.9 kg)  02/03/17 143 lb 8 oz (65.1 kg)  05/02/16 145 lb (65.8 kg)   Exercise: walking 5 times a week.  Diet: decreased portion size, low carb.   Social History /Family History/Past Medical History reviewed in detail and updated in EMR if needed. Blood pressure 114/70, pulse 66, temperature 97.9 F (36.6 C), temperature source Oral, height 5' 2.75" (1.594 m), weight 138 lb 12 oz (62.9 kg).  Review of Systems  Constitutional: Negative for fatigue and fever.  HENT: Negative for congestion.   Eyes: Negative for pain.  Respiratory: Negative for cough and shortness of breath.   Cardiovascular: Negative for chest pain, palpitations and leg swelling.  Gastrointestinal: Negative for abdominal pain.  Genitourinary: Negative for dysuria and vaginal bleeding.  Musculoskeletal: Negative for back pain.  Neurological: Negative for syncope, light-headedness and headaches.  Psychiatric/Behavioral: Negative for dysphoric mood.       Objective:   Physical Exam  Constitutional: Vital signs are normal. She appears well-developed and well-nourished. She is cooperative.  Non-toxic appearance. She does not appear ill. No distress.  HENT:  Head: Normocephalic.  Right Ear: Hearing, tympanic membrane, external ear and ear canal normal.  Left Ear: Hearing, tympanic membrane, external ear and ear canal normal.  Nose: Nose normal.  Eyes: Pupils are equal, round, and reactive to light. Conjunctivae, EOM and lids are normal. Lids are everted and swept, no foreign  bodies found.  Neck: Trachea normal and normal range of motion. Neck supple. Carotid bruit is not present. No thyroid mass and no thyromegaly present.  Cardiovascular: Normal rate, regular rhythm, S1 normal, S2 normal, normal heart sounds and intact distal pulses.  Exam reveals no gallop.   No murmur heard. Pulmonary/Chest: Effort normal and breath sounds normal. No respiratory distress. She has no wheezes. She has no rhonchi. She has no rales.  Abdominal: Soft. Normal appearance and bowel sounds are normal. She exhibits no distension, no fluid wave, no abdominal bruit and no mass. There is no hepatosplenomegaly. There is no tenderness. There is no rebound, no guarding and no CVA tenderness. No hernia.  Lymphadenopathy:    She has no cervical adenopathy.    She has no axillary adenopathy.  Neurological: She is alert. She has normal strength. No cranial nerve deficit or sensory deficit.  Skin: Skin is warm, dry and intact. No rash noted.  Psychiatric: Her speech is normal and behavior is normal. Judgment normal. Her mood appears not anxious. Cognition and memory are normal. She does not exhibit a depressed mood.          Assessment & Plan:  The patient's preventative maintenance and recommended screening tests for an annual wellness exam were reviewed in full today. Brought up to date unless services declined.  Counselled on the importance of diet, exercise, and its role in overall health and mortality. The patient's FH and SH was reviewed, including their home life, tobacco status, and drug and alcohol status.   Vaccines: due flu,  tdap Pap/DVE:  Last 01/21/2015, plan repeat 5 years. plans to do here. Will do DVE every few years. Mammo:  Due, mother with family history Colon: 01/25/2012.Marland Kitchen [facility], rec repeat 5 years, father with colon cancer.  Smoking Status: none ETOH/ drug use: none/none  Hep C: done   HIV screen:   refused

## 2017-05-06 NOTE — Patient Instructions (Addendum)
Call to schedule mammogram on your own.  Please stop at the front desk to set up referral.  Keep up great work with healthy eating and regular exercise.

## 2017-05-26 ENCOUNTER — Encounter: Payer: Self-pay | Admitting: Family Medicine

## 2017-08-03 ENCOUNTER — Telehealth: Payer: Self-pay

## 2017-08-03 NOTE — Telephone Encounter (Signed)
Ginger, contacted pt to schedule her for her colonoscopy.  During our discussion she asked the following questions and I was unable to answer please advise.  She has requested Dr. Allen Norris for her procedure.  1.  Can she have a flex sigmoidoscopy instead of full colonoscopy?  2.  She has no insurance how much would it cost her?  I told her I would see if you could call her back if not today tomorrow.  Thanks, Sharyn Lull

## 2017-08-03 NOTE — Telephone Encounter (Signed)
Dr. Allen Norris, I spoke with this pt regarding her request to do just a flex sig. I explained the risk of not being able to do a full colonoscopy. Her last colon was done 5 years ago and 3 polyps were removed resulting as tubular adenomas. Her father developed colon cancer in his 64's. She currently doesn't have health insurance and is concerned with the cost. Please advise or contact pt with to explain the risk more.

## 2017-08-03 NOTE — Telephone Encounter (Signed)
Let the patient know that her request for sigmoidoscopy only looks at the lower one third of the colon and will miss two thirds of the colon.  The patient may want to investigate having it done at [facility] if she has no insurance and can't get patient assistance.  Otherwise a flexible sigmoidoscopy is not the correct exam for her

## 2017-08-04 NOTE — Telephone Encounter (Signed)
Pt notified of Dr. Dorothey Baseman recommendation. Pt also notified of a self pay colonoscopy. Pt will contact [facility] for a quote on that portion of her colonoscopy. Pt will call me back when she decides to schedule.

## 2017-08-12 ENCOUNTER — Encounter: Payer: Self-pay | Admitting: *Deleted

## 2017-10-08 ENCOUNTER — Telehealth: Payer: Self-pay | Admitting: *Deleted

## 2017-10-08 MED ORDER — OSELTAMIVIR PHOSPHATE 75 MG PO CAPS: 75.0000 mg | ORAL_CAPSULE | Freq: Two times a day (BID) | ORAL | 0 refills | Status: DC

## 2017-10-08 NOTE — Telephone Encounter (Signed)
Pt called stating her son was diagnosed with the flu.  She wanted to know if she needed to come in or if Dr. Ethelene Hal would call her some Tamilflu.  Per Dr. Ethelene Hal okay to call in Tamiflu 75 mg bid x5 days.  Rx sent in electronically to [facility].  Pt aware

## 2017-10-10 ENCOUNTER — Telehealth: Payer: Self-pay

## 2017-10-10 NOTE — Telephone Encounter (Signed)
PLEASE NOTE: All timestamps contained within this report are represented as [place] Standard Time. CONFIDENTIALTY NOTICE: This fax transmission is intended only for the addressee. It contains information that is legally privileged, confidential or otherwise protected from use or disclosure. If you are not the intended recipient, you are strictly prohibited from reviewing, disclosing, copying using or disseminating any of this information or taking any action in reliance on or regarding this information. If you have received this fax in error, please notify us immediately by telephone so that we can arrange for its return to [place]. Phone: [phone], Toll-Free: [phone], Fax: [phone] Page: 1 of 2 Call Id: [id number] [place] Patient Name: Abigail Lopez Derosia Gender: Unknown DOB: Nov 15, 1960 Age: 57 Y 42 M 2 D Return Phone Number: [phone] (Primary) Address: City/State/Zip: [place] - Client Client Site [facility] Eliezer Lofts - MD Contact Type Call Who Is Calling Patient / Member / Family / Caregiver Call Type Triage / Clinical Relationship To Patient Self Return Phone Number [phone] (Primary) Chief Complaint Cough Reason for Call Symptomatic / Request for Health Information Initial Comment caller reports she was exposed to flu in house. Has dry cough. No fever. Nose is dripping and has been sneezing. Has a slight wheeze chest feels tight below throat. Has exercise induced asthma Has been using albuterol inhaler Translation No Nurse Assessment Nurse: Germain Osgood, RN, Opal Sidles Date/Time Eilene Ghazi Time): 10/07/2017 10:42:40 PM Confirm and document reason for call. If symptomatic, describe symptoms. ---caller reports she was exposed to flu in house. Has dry cough. No fever. Nose is dripping and has been  sneezing. Has a slight wheeze chest feels tight below throat. Has exercise induced asthma Has been using albuterol inhaler Son is being treated for flu Does the patient have any new or worsening symptoms? ---Yes Will a triage be completed? ---Yes Related visit to physician within the last 2 weeks? ---No Does the PT have any chronic conditions? (i.e. diabetes, asthma, etc.) ---Yes List chronic conditions. ---Exercise induce asthma Is this a behavioral health or substance abuse call? ---No Guidelines Guideline Title Affirmed Question Affirmed Notes Nurse Date/Time (Eastern Time) Influenza - Seasonal [1] Patient is NOT HIGH RISK AND [2] strongly requests antiviral medicine AND [3] flu symptoms present < 48 hours Abigail Lopez 10/07/2017 10:43:32 PM Disp. Time Eilene Ghazi Time) Disposition Final User PLEASE NOTE: All timestamps contained within this report are represented as [place] Standard Time. CONFIDENTIALTY NOTICE: This fax transmission is intended only for the addressee. It contains information that is legally privileged, confidential or otherwise protected from use or disclosure. If you are not the intended recipient, you are strictly prohibited from reviewing, disclosing, copying using or disseminating any of this information or taking any action in reliance on or regarding this information. If you have received this fax in error, please notify us immediately by telephone so that we can arrange for its return to [place]. Phone: [phone], Toll-Free: [phone], Fax: [phone] Page: 2 of 2 Call Id: [id number] 10/07/2017 10:46:56 PM Call PCP within 24 Hours Yes Germain Osgood, RN, Irving Copas Disagree/Comply Comply Caller Understands Yes PreDisposition [place] Advice Given Per Guideline CALL PCP WITHIN 24 HOURS: You need to discuss this with your doctor within the next 24 hours. * Antiviral drugs (such as Tamiflu) must be started within 48 hours of the start of flu symptoms to be  helpful. If  the flu symptoms or fever started more than 48 hours ago, they are not useful. Adults who are at [place] because of major illnesses may benefit from starting an antiviral drug more than 48 hours after start of flu symptoms. PRESCRIPTION ANTIVIRAL DRUGS FOR INFLUENZA: * Most healthy adults do not need an antiviral drug. The benefits of taking an antiviral are limited. If started within 48 hours, they may reduce the time you are sick by 1 to 1.5 days. They improve the symptoms, but do not eliminate them. INFLUENZA - GENERAL CARE ADVICE * Cough: Use cough drops. * Feeling dehydrated: Drink extra liquids. If the air in your home is dry, use a humidifier. * Muscle aches, headache, and other pains: Often this comes and goes with the fever. Take acetaminophen every 4-6 hours (Adults 650 mg) OR ibuprofen every 6-8 hours (Adults 400-600 mg). PAIN MEDICINES: * For pain relief, take acetaminophen, ibuprofen, or naproxen. NO ASPIRIN: Do not use aspirin for treatment of fever or pain (Reason: there is an association between influenza and Arvin Collard' Syndrome). FOR A RUNNY NOSE - BLOW YOUR NOSE: * Nasal mucus and discharge help wash viruses and bacteria out of the nose and sinuses. * Blowing your nose helps clean out your nose. Use a handkerchief or a paper tissue. COUGHING SPELLS: * Drink warm fluids. Inhale warm mist. (Reason: both relax the airway and loosen up the phlegm) * Suck on cough drops or hard candy to coat the irritated throat. CALL BACK IF: * Fever lasts over 3 days * Runny nose lasts over 10 days * Cough lasts over 3 weeks * Difficulty breathing occurs * You become worse. CARE ADVICE given per INFLUENZA - SEASONAL (Adult) guideline. Referrals [facility]

## 2017-10-10 NOTE — Telephone Encounter (Signed)
Per med list Dr Ethelene Hal sent in tamiflu.

## 2017-11-07 ENCOUNTER — Telehealth: Payer: Self-pay | Admitting: Family Medicine

## 2017-11-07 NOTE — Telephone Encounter (Signed)
Copied from [place] [id number]. Topic: Quick Communication - See Telephone Encounter >> Nov 07, 2017 11:16 AM Boyd Kerbs wrote: CRM for notification.   Pt. Asking if needs measles booster shot Also asking if had shingles shot?   Please call  See Telephone encounter for: 11/07/17.

## 2017-11-08 NOTE — Telephone Encounter (Signed)
Abigail Lopez notified as instructed by telephone.  She will call back to schedule a nurse visit for MMR if she decides to do that.

## 2017-11-08 NOTE — Telephone Encounter (Signed)
You can either get one MMR booster or have labs drawn for a titer if you believe you received MMR between [place].  Yes I recommend shingles but  Mainly after age 57.. Currently it is not easily available.

## 2017-12-30 ENCOUNTER — Encounter: Payer: Self-pay | Admitting: Emergency Medicine

## 2017-12-30 ENCOUNTER — Emergency Department
Admission: EM | Admit: 2017-12-30 | Discharge: 2017-12-30 | Disposition: A | Discharge status: Home / Self Care | Payer: Self-pay | Attending: Emergency Medicine | Admitting: Emergency Medicine

## 2017-12-30 ENCOUNTER — Emergency Department: Payer: Self-pay

## 2017-12-30 DIAGNOSIS — F0781 Postconcussional syndrome: Secondary | ICD-10-CM | POA: Insufficient documentation

## 2017-12-30 DIAGNOSIS — J45909 Unspecified asthma, uncomplicated: Secondary | ICD-10-CM | POA: Insufficient documentation

## 2017-12-30 DIAGNOSIS — Z87891 Personal history of nicotine dependence: Secondary | ICD-10-CM | POA: Insufficient documentation

## 2017-12-30 DIAGNOSIS — Z79899 Other long term (current) drug therapy: Secondary | ICD-10-CM | POA: Insufficient documentation

## 2017-12-30 DIAGNOSIS — R51 Headache: Secondary | ICD-10-CM | POA: Insufficient documentation

## 2017-12-30 MED ORDER — ONDANSETRON HCL 4 MG/2ML IJ SOLN
4.0000 mg | Freq: Once | INTRAMUSCULAR | Status: AC
  Administered 2017-12-30: 4 mg via INTRAVENOUS
  Filled 2017-12-30: qty 2

## 2017-12-30 MED ORDER — SODIUM CHLORIDE 0.9 % IV SOLN
1000.0000 mL | Freq: Once | INTRAVENOUS | Status: AC
  Administered 2017-12-30: 1000 mL via INTRAVENOUS

## 2017-12-30 MED ORDER — BUTALBITAL-APAP-CAFFEINE 50-325-40 MG PO TABS: 1.0000 | ORAL_TABLET | Freq: Four times a day (QID) | ORAL | 0 refills | Status: DC | PRN

## 2017-12-30 MED ORDER — KETOROLAC TROMETHAMINE 30 MG/ML IJ SOLN
30.0000 mg | Freq: Once | INTRAMUSCULAR | Status: AC
  Administered 2017-12-30: 30 mg via INTRAVENOUS
  Filled 2017-12-30: qty 1

## 2017-12-30 NOTE — ED Notes (Signed)
Pt verbalizes understanding of d/c instructions, medications and f/u

## 2017-12-30 NOTE — ED Notes (Signed)
Pt placed in bed, no distress noted.

## 2017-12-30 NOTE — ED Notes (Signed)
Patient transported to CT 

## 2017-12-30 NOTE — ED Provider Notes (Signed)
[facility] Emergency Department Provider Note   ____________________________________________    I have reviewed the triage vital signs and the nursing notes.   HISTORY  Chief Complaint Headache     HPI Abigail Lopez is a 57 y.o. female who presents with complaints of headache.  Patient notes nearly 2 months ago she suffered a head injury and believes that she developed a concussion at that time, she had been doing better but then over the last 1-1/2 months has also suffered other relatively minor head injuries that she thinks has exacerbated the initial head injury.  No neuro deficits.  Complains of global throbbing headache that worsened today.  No imaging after the first head injury.  No fevers or chills.  No neck pain.   Past Medical History:  Diagnosis Date  . Asthma 1983  . 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  Marland Kitchen Atypical lobular hyperplasia of left breast 2012   Present on stereo biopsy, no additional pathology on wide excision.  . DDD (degenerative disc disease)   . High cholesterol   . History of chicken pox   . Migraine   . Neoplasm of uncertain behavior of breast 2013    Patient Active Problem List   Diagnosis Date Noted  . Mild intermittent allergic asthma without complication 70/17/7939  . Ruptured cervical disc 02/03/2017  . High cholesterol 02/03/2017  . Menstrual migraine 02/03/2017  . Family history of colon cancer in father 02/03/2017  . Family history of breast cancer, mother 12/06/2012    Past Surgical History:  Procedure Laterality Date  . BREAST MASS EXCISION Left 2012  . CESAREAN SECTION  1995  . COLONOSCOPY  2014   [facility]  . DILATION AND CURETTAGE OF UTERUS  2011  . GANGLION CYST EXCISION  2001  . KNEE SURGERY Left 1978    Prior to Admission medications   Medication Sig Start Date End Date  Taking? Authorizing Provider  albuterol (VENTOLIN HFA) 108 (90 Base) MCG/ACT inhaler Inhale 1-2 puffs into the lungs every 6 (six) hours as needed for wheezing or shortness of breath.    [provider]  butalbital-acetaminophen-caffeine (FIORICET, ESGIC) [phone] MG tablet Take 1-2 tablets by mouth every 6 (six) hours as needed for headache. 12/30/17 12/30/18  Lavonia Drafts, MD  cetirizine (ZYRTEC) 10 MG tablet Take 10 mg by mouth as needed for allergies.    [provider]  clotrimazole-betamethasone (LOTRISONE) cream Apply 1 application topically 2 (two) times daily. 02/03/17   Bedsole, Amy E, MD  cyclobenzaprine (FLEXERIL) 5 MG tablet Take 5 mg by mouth as needed for muscle spasms.    [provider]  Lifitegrast Shirley Friar) 5 % SOLN Apply 1 drop to eye 2 (two) times daily.    [provider]  metroNIDAZOLE (METROGEL) 1 % gel Apply 1 application topically at bedtime. +iVERMECTIN 1% for Rosacea    [provider]  naproxen sodium (ANAPROX) 220 MG tablet Take 220 mg by mouth as needed.    [provider]  oseltamivir (TAMIFLU) 75 MG capsule Take 1 capsule (75 mg total) by mouth 2 (two) times daily. 10/08/17   Libby Maw, MD     Allergies Atarax [hydroxyzine]; Hydrocodone; Other; Oxycodone; Tandearil [oxyphenbutazone]; and Benadryl [diphenhydramine]  Family History  Problem Relation Age of Onset  . Cancer Mother 24       breast   . Liver  cancer Mother 38  . Ovarian cancer Paternal Grandmother   . Arthritis Paternal Grandmother   . Colon cancer Father   . Arthritis Father   . Hyperlipidemia Father   . Heart disease Father   . Stroke Sister   . Heart disease Paternal Grandfather     Social History Social History   Tobacco Use  . Smoking status: Former Smoker    Packs/day: 1.00    Years: 4.00    Pack years: 4.00    Types: Cigarettes  . Smokeless tobacco: Never Used  Substance Use Topics  . Alcohol use: No     Alcohol/week: 0.0 oz  . Drug use: No    Review of Systems  Constitutional: No change in vision Eyes: No visual changes.  ENT: No neck pain Cardiovascular: Denies chest pain. Respiratory: Denies shortness of breath. Gastrointestinal: No abdominal pain.  No nausea, no vomiting.   Genitourinary: Negative for dysuria. Musculoskeletal: Negative for back pain. Skin: Negative for rash. Neurological: Negative for weakness   ____________________________________________   PHYSICAL EXAM:  VITAL SIGNS: ED Triage Vitals  Enc Vitals Group     BP 12/30/17 1239 (!) 165/74     Pulse Rate 12/30/17 1239 69     Resp 12/30/17 1239 18     Temp 12/30/17 1239 98.2 F (36.8 C)     Temp Source 12/30/17 1239 Oral     SpO2 12/30/17 1239 99 %     Weight 12/30/17 1242 68 kg (150 lb)     Height 12/30/17 1242 1.6 m (5\' 3" )     Head Circumference --      Peak Flow --      Pain Score 12/30/17 1239 5     Pain Loc --      Pain Edu? --      Excl. in [place]? --     Constitutional: Alert and oriented. No acute distress. Pleasant and interactive Eyes: PERRLA, EOMI  Nose: No congestion/rhinnorhea. Mouth/Throat: Mucous membranes are moist.    Cardiovascular: Normal rate, regular rhythm.  Good peripheral circulation. Respiratory: Normal respiratory effort.  No retractions.  Gastrointestinal: Soft and nontender. No distention.    Musculoskeletal:   Warm and well perfused Neurologic:  Normal speech and language. No gross focal neurologic deficits are appreciated.  Cranial nerves II through XII are normal Skin:  Skin is warm, dry and intact. No rash noted. Psychiatric: Mood and affect are normal. Speech and behavior are normal.  ____________________________________________   LABS (all labs ordered are listed, but only abnormal results are displayed)  Labs Reviewed - No data to  display ____________________________________________  EKG None ____________________________________________  RADIOLOGY  CT head negative for acute injury ____________________________________________   PROCEDURES  Procedure(s) performed: No  Procedures   Critical Care performed: No ____________________________________________   INITIAL IMPRESSION / ASSESSMENT AND PLAN / ED COURSE  Pertinent labs & imaging results that were available during my care of the patient were reviewed by me and considered in my medical decision making (see chart for details).  Patient well-appearing in no acute distress, neuro exam is normal.  Suspect postconcussive syndrome will obtain CT head given lack of imaging thus far.  She has neurology appointment in 2 days.  Has already seen ENT.  No fevers or chills to suggest infectious etiology.  Will treat with IV Toradol and IV fluids  Patient CT scan reassuring  She feels significantly better after IV Toradol.  Appropriate for discharge at this time given close outpatient follow-up with neurology.  Return precautions discussed    ____________________________________________   FINAL CLINICAL IMPRESSION(S) / ED DIAGNOSES  Final diagnoses:  Post concussive syndrome        Note:  This document was prepared using Dragon voice recognition software and may include unintentional dictation errors.    Lavonia Drafts, MD 12/30/17 1505

## 2017-12-30 NOTE — ED Triage Notes (Signed)
Patient fell and hit her head on a metal chair on the 5th of May.  Patient has had an intermittent headache since then.  Patient states, "she jostled her head on May 27th.  Patient saw Dr. Tami Ribas on Wednesday and was told to come to the ED if she was feeling badly.  Patient has an appointment with neurology on Monday.  Patient denies dizziness and weakness.  Patient states, "when I walk, I veer to the right."  Patient states, "Dr. Tami Ribas said I might need an MRI."

## 2018-01-02 ENCOUNTER — Encounter: Payer: Self-pay | Admitting: Family Medicine

## 2018-01-05 ENCOUNTER — Telehealth: Payer: Self-pay | Admitting: Family Medicine

## 2018-01-05 ENCOUNTER — Encounter: Payer: Self-pay | Admitting: Family Medicine

## 2018-01-05 NOTE — Telephone Encounter (Signed)
Spoke to pt and advised her I was unable to locate her recent labs in care everywhere. States she will drop off a copy today for Dr Diona Browner to review to see if injections are required or if she can take OTC

## 2018-01-05 NOTE — Telephone Encounter (Signed)
Yes.. I am agreeable to start injections and agree with vit 12 as he suggested.  b12 1047mcg  IM once weekly x 4 weeks, then once monthly x 4 months. In addition to starting  Oral B12 1055mcg daily  Please set this up for pt.  Did Dr. Manuella Ghazi given her a vit D rx or d I need to?  Return for b12 and vit D labs in 3 months.. Here or with Dr. Manuella Ghazi.

## 2018-01-05 NOTE — Telephone Encounter (Signed)
Patient's husband dropped off lab results. Results sent for Dr Diona Browner to Wilma Flavin, RMA

## 2018-01-05 NOTE — Telephone Encounter (Signed)
Copied from [place] [id number]. Topic: Quick Communication - See Telephone Encounter >> Jan 05, 2018 10:30 AM Rutherford Nail, NT wrote: CRM for notification. See Telephone encounter for: 01/05/18. Patient calling and states that she was seen by Dr [facility]uella [facility]. States that the labs that he drew had shown that her Vitamin B12 and D3 were low. Would like to know if she could get injections for these? CB#: [phone]

## 2018-01-05 NOTE — Telephone Encounter (Signed)
Originally placed on the cart, delivered to Dr Diona Browner now-Anastasiya Estell Harpin, RMA

## 2018-01-05 NOTE — Telephone Encounter (Addendum)
Heike notified as instructed by telephone.  Nurse visit scheduled for 01/17/2018 at 3:30 pm for first Vitamin B12 injection.

## 2018-01-05 NOTE — Telephone Encounter (Signed)
Not in my inbox... Where were the placed?

## 2018-01-05 NOTE — Telephone Encounter (Signed)
Will review upon reciept

## 2018-01-06 MED ORDER — CYANOCOBALAMIN 1000 MCG/ML IJ SOLN: INTRAMUSCULAR | 10 refills | Status: DC

## 2018-01-06 NOTE — Telephone Encounter (Signed)
Pt seen 05/06/17 for annual exam; do not see Vit B12 labs? Please advise.

## 2018-01-06 NOTE — Telephone Encounter (Signed)
Will send in  B12 injections... Labs scanned  already reviewed and scanned in chart. Cancel appt.

## 2018-01-06 NOTE — Telephone Encounter (Signed)
Pt requesting call to notify her of approval.

## 2018-01-06 NOTE — Telephone Encounter (Signed)
[place], [place] - [place] [phone] (Phone) [phone] (Fax)   Pt stated that she discussed getting the b12 shots at local pharmacy as it would be less expensive. They told her $12 per injection but they would need an RX from Dr. Diona Browner. Per Coralyn Mark at the drug store [phone].

## 2018-01-06 NOTE — Telephone Encounter (Signed)
Abigail Lopez notified that Dr. Diona Browner has sent in a prescription for the Vitamin B12 to her pharmacy so she can have her injections done at the pharmacy.  Nurse visit cancelled.  Reminded patient to return for labs in 3 months to recheck her Vitamin B12 and Vitamin D level.

## 2018-01-06 NOTE — Addendum Note (Signed)
Addended byEliezer Lofts E on: 01/06/2018 02:19 PM   Modules accepted: Orders

## 2018-01-17 ENCOUNTER — Ambulatory Visit: Payer: Self-pay

## 2018-03-24 ENCOUNTER — Telehealth: Payer: Self-pay | Admitting: Family Medicine

## 2018-03-24 DIAGNOSIS — E538 Deficiency of other specified B group vitamins: Secondary | ICD-10-CM | POA: Insufficient documentation

## 2018-03-24 DIAGNOSIS — E559 Vitamin D deficiency, unspecified: Secondary | ICD-10-CM | POA: Insufficient documentation

## 2018-03-24 NOTE — Telephone Encounter (Signed)
Pt came in for labs. She said she needs to have her B-12 & D3 labs drawn, next week will be 3 monmths. She is requesting orders for check. She said she takes the shots at pharmacy on [facility] [address].

## 2018-03-24 NOTE — Addendum Note (Signed)
Addended byEliezer Lofts E on: 03/24/2018 05:30 PM   Modules accepted: Orders

## 2018-03-24 NOTE — Telephone Encounter (Signed)
Pt also dropped a copy of result note for B12 for your review. I placed on cart for drop off.

## 2018-03-27 ENCOUNTER — Other Ambulatory Visit (INDEPENDENT_AMBULATORY_CARE_PROVIDER_SITE_OTHER): Payer: Self-pay

## 2018-03-27 DIAGNOSIS — E538 Deficiency of other specified B group vitamins: Secondary | ICD-10-CM

## 2018-03-27 DIAGNOSIS — E559 Vitamin D deficiency, unspecified: Secondary | ICD-10-CM

## 2018-03-27 LAB — VITAMIN B12: Vitamin B-12: 1359 pg/mL — ABNORMAL HIGH (ref 211–911)

## 2018-03-27 LAB — VITAMIN D 25 HYDROXY (VIT D DEFICIENCY, FRACTURES): VITD: 33.48 ng/mL (ref 30.00–100.00)

## 2018-03-28 ENCOUNTER — Other Ambulatory Visit: Payer: Self-pay | Admitting: *Deleted

## 2018-03-28 MED ORDER — CYANOCOBALAMIN 1000 MCG/ML IJ SOLN: INTRAMUSCULAR | 11 refills | Status: DC

## 2018-03-28 NOTE — Telephone Encounter (Signed)
Ms. Markwood ask if Dr. Diona Browner could refill her Vitamin B12.  She was advised to continue her injections one time a month.  Ok to refill?   Her previous instructions stated to inject 1000 mcg IM once weekly x 4 weeks, then once monthly x 4 months. Ok to refill?

## 2018-03-28 NOTE — Telephone Encounter (Signed)
Copied from [place] [id number]. Topic: General - Other >> Mar 28, 2018  8:53 AM Bea Graff, NT wrote: Reason for CRM: Pt states she would like her B12 injection sent to [place], [place] - [place] [phone] (Phone) [phone] (Fax)

## 2018-03-28 NOTE — Telephone Encounter (Signed)
Okay to continue monthly.

## 2018-04-03 ENCOUNTER — Telehealth: Payer: Self-pay | Admitting: *Deleted

## 2018-04-03 NOTE — Telephone Encounter (Signed)
Left message for Abigail Lopez that I am not able to resend her lab results to her MyChart but I do think they will automatically drop to her MyChart now that she is signed up for it. It may take up to 7 days.  I ask that she call me back if after a week her results are not showing up in her MyChart and I will try to look into this further to see how we can make this happen.

## 2018-04-03 NOTE — Telephone Encounter (Signed)
Copied from [place] [id number]. Topic: Quick Communication - Lab Results >> Apr 03, 2018 11:30 AM Oliver Pila B wrote: Pt called to have her labs resent to her mychart; pt has now activated her mychart

## 2018-06-30 ENCOUNTER — Ambulatory Visit
Admission: EM | Admit: 2018-06-30 | Discharge: 2018-06-30 | Disposition: A | Discharge status: Home / Self Care | Payer: Self-pay | Attending: Family Medicine | Admitting: Family Medicine

## 2018-06-30 ENCOUNTER — Ambulatory Visit (INDEPENDENT_AMBULATORY_CARE_PROVIDER_SITE_OTHER): Payer: Self-pay

## 2018-06-30 ENCOUNTER — Other Ambulatory Visit: Payer: Self-pay

## 2018-06-30 ENCOUNTER — Encounter: Payer: Self-pay | Admitting: Emergency Medicine

## 2018-06-30 DIAGNOSIS — W1830XA Fall on same level, unspecified, initial encounter: Secondary | ICD-10-CM

## 2018-06-30 DIAGNOSIS — S83005A Unspecified dislocation of left patella, initial encounter: Secondary | ICD-10-CM | POA: Insufficient documentation

## 2018-06-30 DIAGNOSIS — D1622 Benign neoplasm of long bones of left lower limb: Secondary | ICD-10-CM

## 2018-06-30 NOTE — ED Notes (Signed)
Knee immobilizer to left leg and crutches given with return demonstration

## 2018-06-30 NOTE — ED Provider Notes (Signed)
[facility]    CSN: [id number] Arrival date & time: 06/30/18  1827     History   Chief Complaint Chief Complaint  Patient presents with  . Knee Injury    DOI 06/30/18 left    HPI Abigail Lopez is a 57 y.o. female.   HPI  57 year old female accompanied by her husband presents after sustaining a left knee injury earlier today.  She states that she was in her yard playing with her dog; she took a misstep and as she was trying to maintain her balance her dog hit her on the lateral aspect of her left knee forcing her into varus.  She states that she dislocated her kneecap.  After several minutes she was able to straighten her knee out with the patella returning to place.  Had dislocated her knee previously requiring surgery.  She is had no problem until recently.  Now she has pain over the medial aspect of the knee.  Her patella is in the chondral groove and is moving and tracking normally.  She has swelling of her knee mostly medially.        Past Medical History:  Diagnosis Date  . Asthma 1983  . 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  Marland Kitchen Atypical lobular hyperplasia of left breast 2012   Present on stereo biopsy, no additional pathology on wide excision.  . DDD (degenerative disc disease)   . High cholesterol   . History of chicken pox   . Migraine   . Neoplasm of uncertain behavior of breast 2013    Patient Active Problem List   Diagnosis Date Noted  . Vitamin D deficiency 03/24/2018  . Vitamin B12 deficiency 03/24/2018  . Mild intermittent allergic asthma without complication 72/03/4708  . Ruptured cervical disc 02/03/2017  . High cholesterol 02/03/2017  . Menstrual migraine 02/03/2017  . Family history of colon cancer in father 02/03/2017  . Family history of breast cancer, mother 12/06/2012    Past Surgical History:  Procedure  Laterality Date  . BREAST MASS EXCISION Left 2012  . CESAREAN SECTION  1995  . COLONOSCOPY  2014   [facility]  . DILATION AND CURETTAGE OF UTERUS  2011  . GANGLION CYST EXCISION  2001  . KNEE SURGERY Left 1978    OB History    Gravida  5   Para  3   Term      Preterm      AB  2   Living  2     SAB  2   TAB      Ectopic      Multiple      Live Births           Obstetric Comments  First pregnancy age 21 First menstrual cycle age 65         Home Medications    Prior to Admission medications   Medication Sig Start Date End Date Taking? Authorizing Provider  albuterol (VENTOLIN HFA) 108 (90 Base) MCG/ACT inhaler Inhale 1-2 puffs into the lungs every 6 (six) hours as needed for wheezing or shortness of breath.   Yes [provider]  cetirizine (ZYRTEC) 10 MG tablet Take 10 mg by mouth as needed for allergies.   Yes [provider]  clotrimazole-betamethasone (LOTRISONE) cream Apply 1 application topically 2 (two) times daily. 02/03/17  Yes Bedsole, Amy E, MD  cyanocobalamin (,VITAMIN B-12,)  1000 MCG/ML injection Inect 1065mcg IM every 30 days 03/28/18  Yes Bedsole, Amy E, MD  cyclobenzaprine (FLEXERIL) 5 MG tablet Take 5 mg by mouth as needed for muscle spasms.   Yes [provider]  naproxen sodium (ANAPROX) 220 MG tablet Take 220 mg by mouth as needed.   Yes [provider]  VITAMIN D, CHOLECALCIFEROL, PO Take by mouth. 4000 units daily   Yes [provider]    Family History Family History  Problem Relation Age of Onset  . Cancer Mother 24       breast   . Liver cancer Mother 34  . Ovarian cancer Paternal Grandmother   . Arthritis Paternal Grandmother   . Colon cancer Father   . Arthritis Father   . Hyperlipidemia Father   . Heart disease Father   . Stroke Sister   . Heart disease Paternal Grandfather     Social History Social History   Tobacco Use  . Smoking status: Former Smoker    Packs/day: 1.00     Years: 4.00    Pack years: 4.00    Types: Cigarettes    Last attempt to quit: 1984    Years since quitting: 35.9  . Smokeless tobacco: Never Used  Substance Use Topics  . Alcohol use: No    Alcohol/week: 0.0 standard drinks  . Drug use: No     Allergies   Atarax [hydroxyzine]; Hydrocodone; Other; Oxycodone; Tandearil [oxyphenbutazone]; and Benadryl [diphenhydramine]   Review of Systems Review of Systems  Constitutional: Positive for activity change. Negative for appetite change, chills, fatigue and fever.  Musculoskeletal: Positive for arthralgias and gait problem.  All other systems reviewed and are negative.    Physical Exam Triage Vital Signs ED Triage Vitals  Enc Vitals Group     BP 06/30/18 1842 (!) 149/91     Pulse Rate 06/30/18 1842 85     Resp 06/30/18 1842 16     Temp 06/30/18 1842 98.7 F (37.1 C)     Temp Source 06/30/18 1842 Oral     SpO2 06/30/18 1842 100 %     Weight 06/30/18 1841 129 lb (58.5 kg)     Height 06/30/18 1841 5\' 3"  (1.6 m)     Head Circumference --      Peak Flow --      Pain Score 06/30/18 1841 5     Pain Loc --      Pain Edu? --      Excl. in [place]? --    No data found.  Updated Vital Signs BP (!) 149/91 (BP Location: Right Arm)   Pulse 85   Temp 98.7 F (37.1 C) (Oral)   Resp 16   Ht 5\' 3"  (1.6 m)   Wt 129 lb (58.5 kg)   LMP 01/25/2016 (Within Weeks) Comment: preg waiver signed  SpO2 100%   BMI 22.85 kg/m   Visual Acuity Right Eye Distance:   Left Eye Distance:   Bilateral Distance:    Right Eye Near:   Left Eye Near:    Bilateral Near:     Physical Exam Vitals signs and nursing note reviewed.  Constitutional:      General: She is not in acute distress.    Appearance: Normal appearance. She is normal weight. She is not ill-appearing, toxic-appearing or diaphoretic.  HENT:     Head: Normocephalic.     Nose: Nose normal.     Mouth/Throat:     Mouth: Mucous membranes are moist.  Eyes:     Pupils: Pupils are equal,  round, and reactive to light.  Musculoskeletal:     Comments: Maintains a full extension of her knee and does not want to flex it.  He has mild swelling of the medial knee.  He has a negative patellar apprehension test.  Negative retropatellar tenderness.  Tenderness appears to be over the medial collateral ligament.  Very reluctant to allow full examination due to pain.  Collateral ligament appears to be intact slightly lax but has an endpoint.  Lateral collateral ligament also is intact without significant tenderness.  Skin:    General: Skin is warm and dry.  Neurological:     General: No focal deficit present.     Mental Status: She is alert and oriented to person, place, and time.  Psychiatric:        Mood and Affect: Mood normal.        Behavior: Behavior normal.        Thought Content: Thought content normal.        Judgment: Judgment normal.      UC Treatments / Results  Labs (all labs ordered are listed, but only abnormal results are displayed) Labs Reviewed - No data to display  EKG None  Radiology Dg Knee Complete 4 Views Left  Result Date: 06/30/2018 CLINICAL DATA:  Left knee pain following a twisting injury and fall. EXAM: LEFT KNEE - COMPLETE 4+ VIEW COMPARISON:  None. FINDINGS: Mild patellofemoral spur formation. Proximal tibial enchondroma. No fracture, dislocation or effusion. IMPRESSION: 1. No fracture or effusion. 2. Mild degenerative changes. 3. Proximal tibial enchondroma. Electronically Signed   By: Claudie Revering M.D.   On: 06/30/2018 19:12    Procedures Procedures (including critical care time)  Medications Ordered in UC Medications - No data to display  Initial Impression / Assessment and Plan / UC Course  I have reviewed the triage vital signs and the nursing notes.  Pertinent labs & imaging results that were available during my care of the patient were reviewed by me and considered in my medical decision making (see chart for details).   Will place  the patient in a knee immobilizer for stability.  Also have her use crutches until she is seen by orthopedics next week.  Time she will use ice and elevation.  Tylenol and ibuprofen combination for pain.  We will arrange an appointment with orthopedics the beginning of next week.  Xrays were placed on a CD disc for the patient.   Final Clinical Impressions(s) / UC Diagnoses   Final diagnoses:  Dislocation of left patella, initial encounter     Discharge Instructions     For pain use Tylenol 100 mg combined with ibuprofen 400 mg every 6 hours as necessary for pain.  Take with food.  With your brace on perform isometric exercises for your quadriceps muscle as I showed you in the office.  Crutches with the immobilizer until seen by orthopedic surgery.Apply ice 20 minutes out of every 2 hours 4-5 times daily for comfort.    ED Prescriptions    None     Controlled Substance Prescriptions [place] Controlled Substance Registry consulted? Not Applicable   Lorin Picket, PA-C 06/30/18 1955

## 2018-06-30 NOTE — Discharge Instructions (Addendum)
For pain use Tylenol 100 mg combined with ibuprofen 400 mg every 6 hours as necessary for pain.  Take with food.  With your brace on perform isometric exercises for your quadriceps muscle as I showed you in the office.  Crutches with the immobilizer until seen by orthopedic surgery.Apply ice 20 minutes out of every 2 hours 4-5 times daily for comfort.

## 2018-06-30 NOTE — ED Triage Notes (Signed)
Patient in today after sustaining a left knee injury earlier today. Patient states she was in the yard playing with her dog and took a wrong step and before she could catch her balance the dog caught her on the left side of her knee. Patient states that she dislocated the knee cap. She states that she got it back in place after a little while.

## 2018-08-24 ENCOUNTER — Encounter: Payer: Self-pay | Admitting: *Deleted

## 2018-08-24 ENCOUNTER — Other Ambulatory Visit: Payer: Self-pay

## 2018-09-12 ENCOUNTER — Other Ambulatory Visit: Payer: Self-pay

## 2018-09-12 ENCOUNTER — Encounter: Payer: Self-pay | Admitting: *Deleted

## 2018-09-13 ENCOUNTER — Encounter: Payer: Self-pay | Admitting: Anesthesiology

## 2018-09-22 ENCOUNTER — Ambulatory Visit: Payer: Self-pay | Admitting: Anesthesiology

## 2018-09-22 ENCOUNTER — Ambulatory Visit: Payer: Self-pay

## 2018-09-22 ENCOUNTER — Ambulatory Visit
Admission: RE | Admit: 2018-09-22 | Discharge: 2018-09-22 | Disposition: A | Discharge status: Home / Self Care | Payer: Self-pay | Attending: Orthopedic Surgery | Admitting: Orthopedic Surgery

## 2018-09-22 ENCOUNTER — Ambulatory Visit: Payer: Self-pay | Attending: Orthopedic Surgery

## 2018-09-22 ENCOUNTER — Encounter
Admission: RE | Disposition: A | Discharge status: Home / Self Care | Payer: Self-pay | Source: Home / Self Care | Attending: Orthopedic Surgery

## 2018-09-22 ENCOUNTER — Other Ambulatory Visit: Payer: Self-pay

## 2018-09-22 DIAGNOSIS — J45909 Unspecified asthma, uncomplicated: Secondary | ICD-10-CM | POA: Insufficient documentation

## 2018-09-22 DIAGNOSIS — M2202 Recurrent dislocation of patella, left knee: Secondary | ICD-10-CM | POA: Insufficient documentation

## 2018-09-22 DIAGNOSIS — M1712 Unilateral primary osteoarthritis, left knee: Secondary | ICD-10-CM | POA: Insufficient documentation

## 2018-09-22 DIAGNOSIS — Z87891 Personal history of nicotine dependence: Secondary | ICD-10-CM

## 2018-09-22 DIAGNOSIS — G43909 Migraine, unspecified, not intractable, without status migrainosus: Secondary | ICD-10-CM | POA: Insufficient documentation

## 2018-09-22 DIAGNOSIS — R52 Pain, unspecified: Secondary | ICD-10-CM

## 2018-09-22 HISTORY — PX: KNEE ARTHROSCOPY: SHX127

## 2018-09-22 HISTORY — DX: Other specified postprocedural states: Z98.890

## 2018-09-22 HISTORY — DX: Nausea with vomiting, unspecified: R11.2

## 2018-09-22 SURGERY — ARTHROSCOPY, KNEE
Anesthesia: Regional | Site: Knee | Laterality: Left

## 2018-09-22 MED ORDER — GLYCOPYRROLATE 0.2 MG/ML IJ SOLN
INTRAMUSCULAR | Status: DC | PRN
  Administered 2018-09-22: 0.1 mg via INTRAVENOUS

## 2018-09-22 MED ORDER — ROPIVACAINE HCL 5 MG/ML IJ SOLN
INTRAMUSCULAR | Status: DC | PRN
  Administered 2018-09-22 (×2): 10 mL via PERINEURAL

## 2018-09-22 MED ORDER — PROPOFOL 10 MG/ML IV BOLUS
INTRAVENOUS | Status: DC | PRN
  Administered 2018-09-22: 140 mg via INTRAVENOUS

## 2018-09-22 MED ORDER — LACTATED RINGERS IV SOLN
INTRAVENOUS | Status: DC
  Administered 2018-09-22: 07:00:00 via INTRAVENOUS

## 2018-09-22 MED ORDER — DEXTROSE 5 % IV SOLN
2000.0000 mg | Freq: Once | INTRAVENOUS | Status: AC
  Administered 2018-09-22: 2000 mg via INTRAVENOUS

## 2018-09-22 MED ORDER — TRAMADOL HCL 50 MG PO TABS: 50.0000 mg | ORAL_TABLET | ORAL | 0 refills | Status: DC | PRN

## 2018-09-22 MED ORDER — ONDANSETRON HCL 4 MG/2ML IJ SOLN
INTRAMUSCULAR | Status: DC | PRN
  Administered 2018-09-22: 4 mg via INTRAVENOUS

## 2018-09-22 MED ORDER — TRAMADOL HCL 50 MG PO TABS
50.0000 mg | ORAL_TABLET | Freq: Once | ORAL | Status: AC | PRN
  Administered 2018-09-22: 50 mg via ORAL

## 2018-09-22 MED ORDER — ONDANSETRON 4 MG PO TBDP
4.0000 mg | ORAL_TABLET | Freq: Once | ORAL | Status: AC
  Administered 2018-09-22: 4 mg via ORAL

## 2018-09-22 MED ORDER — IBUPROFEN 800 MG PO TABS: 800.0000 mg | ORAL_TABLET | Freq: Three times a day (TID) | ORAL | 0 refills | Status: AC

## 2018-09-22 MED ORDER — LIDOCAINE HCL (CARDIAC) PF 100 MG/5ML IV SOSY
PREFILLED_SYRINGE | INTRAVENOUS | Status: DC | PRN
  Administered 2018-09-22: 40 mg via INTRATRACHEAL

## 2018-09-22 MED ORDER — ACETAMINOPHEN 500 MG PO TABS: 1000.0000 mg | ORAL_TABLET | Freq: Three times a day (TID) | ORAL | 2 refills | Status: AC

## 2018-09-22 MED ORDER — ASPIRIN EC 325 MG PO TBEC: 325.0000 mg | DELAYED_RELEASE_TABLET | Freq: Every day | ORAL | 0 refills | Status: AC

## 2018-09-22 MED ORDER — MIDAZOLAM HCL 5 MG/5ML IJ SOLN
INTRAMUSCULAR | Status: DC | PRN
  Administered 2018-09-22: 2 mg via INTRAVENOUS

## 2018-09-22 MED ORDER — LIDOCAINE-EPINEPHRINE 1 %-1:100000 IJ SOLN
INTRAMUSCULAR | Status: DC | PRN
  Administered 2018-09-22: 1 mL
  Administered 2018-09-22: 3 mL

## 2018-09-22 MED ORDER — ONDANSETRON 4 MG PO TBDP: 4.0000 mg | ORAL_TABLET | Freq: Three times a day (TID) | ORAL | 0 refills | Status: DC | PRN

## 2018-09-22 MED ORDER — FENTANYL CITRATE (PF) 100 MCG/2ML IJ SOLN
INTRAMUSCULAR | Status: DC | PRN
  Administered 2018-09-22: 25 ug via INTRAVENOUS
  Administered 2018-09-22: 100 ug via INTRAVENOUS
  Administered 2018-09-22 (×3): 25 ug via INTRAVENOUS

## 2018-09-22 MED ORDER — DEXAMETHASONE SODIUM PHOSPHATE 4 MG/ML IJ SOLN
INTRAMUSCULAR | Status: DC | PRN
  Administered 2018-09-22: 4 mg via INTRAVENOUS

## 2018-09-22 MED ORDER — EPHEDRINE SULFATE 50 MG/ML IJ SOLN
INTRAMUSCULAR | Status: DC | PRN
  Administered 2018-09-22 (×5): 5 mg via INTRAVENOUS

## 2018-09-22 SURGICAL SUPPLY — 64 items
ACL TOOL BOX LOANER (MISCELLANEOUS) ×2
ADAPTER IRRIG TUBE 2 SPIKE SOL (ADAPTER) ×4 IMPLANT
BLADE SURG SZ11 CARB STEEL (BLADE) ×2 IMPLANT
BNDG COHESIVE 4X5 TAN STRL (GAUZE/BANDAGES/DRESSINGS) ×2 IMPLANT
BNDG ESMARK 6X12 TAN STRL LF (GAUZE/BANDAGES/DRESSINGS) ×2 IMPLANT
BOX TOOL ACL LOANER (MISCELLANEOUS) ×1 IMPLANT
BUR RADIUS 3.5 (BURR) ×2 IMPLANT
CHLORAPREP W/TINT 26ML (MISCELLANEOUS) ×2 IMPLANT
COOLER POLAR GLACIER W/PUMP (MISCELLANEOUS) ×2 IMPLANT
COVER LIGHT HANDLE UNIVERSAL (MISCELLANEOUS) ×4 IMPLANT
CUFF TOURN SGL QUICK 34 (TOURNIQUET CUFF) ×1
CUFF TRNQT CYL 34X4X40X1 (TOURNIQUET CUFF) ×1 IMPLANT
DECANTER SPIKE VIAL GLASS SM (MISCELLANEOUS) ×2 IMPLANT
DERMABOND ADVANCED (GAUZE/BANDAGES/DRESSINGS) ×1
DERMABOND ADVANCED .7 DNX12 (GAUZE/BANDAGES/DRESSINGS) ×1 IMPLANT
DRAPE C-ARM XRAY 36X54 (DRAPES) ×4 IMPLANT
DRAPE C-ARMOR (DRAPES) ×2 IMPLANT
DRAPE IMP U-DRAPE 54X76 (DRAPES) ×2 IMPLANT
DRAPE U-SHAPE 48X52 POLY STRL (PACKS) ×2 IMPLANT
DX Fibertak disposable kit ×2 IMPLANT
FiberTak DX Suture Anchor with1.3mm SutureTape ×4 IMPLANT
GAUZE SPONGE 4X4 12PLY STRL (GAUZE/BANDAGES/DRESSINGS) ×2 IMPLANT
GLOVE BIO SURGEON STRL SZ7.5 (GLOVE) ×2 IMPLANT
GLOVE BIOGEL PI IND STRL 8 (GLOVE) ×1 IMPLANT
GLOVE BIOGEL PI INDICATOR 8 (GLOVE) ×1
GOWN STRL REUS W/ TWL LRG LVL3 (GOWN DISPOSABLE) ×4 IMPLANT
GOWN STRL REUS W/TWL LRG LVL3 (GOWN DISPOSABLE) ×8 IMPLANT
Graft Pro Loaner Tray ×2 IMPLANT
IV LACTATED RINGER IRRG 3000ML (IV SOLUTION) ×4
IV LR IRRIG 3000ML ARTHROMATIC (IV SOLUTION) ×4 IMPLANT
KIT ACL DISPOSABLE (KITS) ×2 IMPLANT
KIT BIO-TENODESIS 3X8 DISP (MISCELLANEOUS) ×1
KIT INSRT BABSR STRL DISP BTN (MISCELLANEOUS) ×1 IMPLANT
KIT TURNOVER KIT A (KITS) ×2 IMPLANT
MAT ABSORB  FLUID 56X50 GRAY (MISCELLANEOUS) ×1
MAT ABSORB FLUID 56X50 GRAY (MISCELLANEOUS) ×1 IMPLANT
NEEDLE HYPO 21X1.5 SAFETY (NEEDLE) ×2 IMPLANT
NEPTUNE MANIFOLD (MISCELLANEOUS) ×2 IMPLANT
PACK ARTHROSCOPY KNEE (MISCELLANEOUS) ×2 IMPLANT
PAD ABD DERMACEA PRESS 5X9 (GAUZE/BANDAGES/DRESSINGS) ×4 IMPLANT
PAD WRAPON POLAR KNEE (MISCELLANEOUS) ×1 IMPLANT
PADDING CAST BLEND 6X4 STRL (MISCELLANEOUS) ×1 IMPLANT
PADDING STRL CAST 6IN (MISCELLANEOUS) ×1
REAMER LO PROFILE 7 (MISCELLANEOUS) ×2 IMPLANT
SCREW BIOCOMP 7X20 (Screw) ×2 IMPLANT
SET TUBE SUCT SHAVER OUTFL 24K (TUBING) ×2 IMPLANT
SET TUBE TIP INTRA-ARTICULAR (MISCELLANEOUS) ×2 IMPLANT
SUT 2 FIBERLOOP 20 STRT BLUE (SUTURE) ×4
SUT ETHILON 3-0 FS-10 30 BLK (SUTURE) ×4
SUT MNCRL 4-0 (SUTURE) ×1
SUT MNCRL 4-0 27XMFL (SUTURE) ×1
SUT VIC AB 0 CT1 36 (SUTURE) ×2 IMPLANT
SUT VIC AB 2-0 CT2 27 (SUTURE) ×4 IMPLANT
SUT VIC AB 2-0 SH 27 (SUTURE) ×1
SUT VIC AB 2-0 SH 27XBRD (SUTURE) ×1 IMPLANT
SUTURE 2 FIBERLOOP 20 STRT BLU (SUTURE) ×2 IMPLANT
SUTURE EHLN 3-0 FS-10 30 BLK (SUTURE) ×2 IMPLANT
SUTURE MNCRL 4-0 27XMF (SUTURE) ×1 IMPLANT
SYR BULB IRRIG 60ML STRL (SYRINGE) ×2 IMPLANT
Semi-tendinosus tendon (Graft) ×2 IMPLANT
TOWEL OR 17X26 4PK STRL BLUE (TOWEL DISPOSABLE) ×4 IMPLANT
TUBING ARTHRO INFLOW-ONLY STRL (TUBING) ×2 IMPLANT
WRAPON POLAR PAD KNEE (MISCELLANEOUS) ×2
disposables kit for fibertak dx suture ancho ×2 IMPLANT

## 2018-09-22 NOTE — Op Note (Signed)
OPERATIVE NOTE  09/22/2018  PRE-OP DIAGNOSIS:  1. Left patella degenerative changes 2. Left recurrent patellar dislocation   POST-OP DIAGNOSIS:  1. 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 Left recurrent patellar dislocation  PROCEDURES:  1. Left knee medial patellofemoral ligament reconstruction using allograft 2. Left arthroscopic patella and medial femoral condyle chondroplasty  SURGEON:Lynn Recendiz Jearld Lesch, MD  ASSISTANT(S): Reche Dixon, PA  ANESTHESIA: regional block + Gen  TOTAL IV FLUIDS: see anesthesia record  ESTIMATED BLOOD LOSS: 5cc  TOURNIQUET TIME: 129 min  SPECIMENS: None  IMPLANTS:  Arthrex 7 x 67mm Biocomposite interference screw Arthrex DX FiberTak Suture anchors x 2 Semitendinosus allograft   COMPLICATIONS: None apparent.  INDICATIONS: Abigail Lopez is a 58 y.o. female with Left knee pain and recurrent patellar instability.  She has now had 5 patellar dislocations.  She feels that she no longer can trust her knee to perform any day-to-day or recreational activities.  Of note, she had a prior surgery after her second dislocation in the 1970s involving use of her patellar tendon. After discussion of risks, benefits, and alternatives to surgery, the patient elected to proceed with the hopes of preventing recurrent patellar dislocation.  OPERATIVE FINDINGS:   Examination under anesthesia:A careful examination under anesthesia was performed. Passive range of motion was: Hyperextension: 2. Extension: 0. Flexion: 135. Lachman: normal. Pivot Shift: normal. Posterior drawer: normal. Varus stability in full extension: normal. Varus stability in 30 degrees of flexion: normal. Valgus stability in full extension: normal. Valgus stability in 30 degrees of flexion: normal. Patella: 3 quadrants lateral mobility with patella self-reducing, 2 quadrants medial mobility, no significant lateral patellar tilt  Intra-operative findings:A  thorough arthroscopic examination of the knee was performed. The findings are: 1. Suprapatellar pouch: normal 2. Undersurface of median ridge: Significant Grade 3-4 cartilage defect measuring approximately 12 x 15 mm and extending towards the medial patellar facet 3. Medial patellar facet: Areas of grade 3-4 degenerative changes 4. Lateral patellar facet: Grade 1 degenerative changes.  No significant overhang of lateral patella compared to lateral border of trochlea.  5. Trochlea: Normal 6. Lateral gutter/popliteus tendon: Normal 7. Hoffa's fat pad: Normal 8. Medial gutter/plica: Normal 9. ACL: Normal.  Normal 10. PCL: Normal 11. Medial meniscus: Normal 12. Medial compartment cartilage: Grade 3 degenerative changes to the medial femoral condyle diffusely 13. Lateral meniscus: Normal 14. Lateral compartment cartilage: Grade 1 softening to the lateral femoral condyle  DETAILS OF PROCEDURE: After satisfactory right lower extremity regional block was achieved in the preoperative holding area, the patient was brought to the operating room.  She was placed supine on the OR table.  A tourniquet was placed on the thigh. The operative extremity was prescrubbed with Hibiclens and alcohol, prepped with ChloraPrep, and draped in the usual sterile fashion. The patient was given preoperative IV antibiotics and a surgical time-out confirming patient identity, procedure, and laterality was performed.   Tourniquet was inflated after Esmarch bandage was used to exsanguinate the leg. First, the arthroscopic portion was performed. A standard anterolateral portal was made with an 11 blade. A standard anteromedial portal was made using needle localization. A diagnostic arthroscopy was performed with the above findings.  A chondroplasty was performed of the medial femoral condyle and patella such that there were no loose fragments of cartilage.  This was performed with an oscillating shaver.  Next, a standard medial  parapatellar arthrotomy was performed. Layer 2 was dissected free from Layer 3 (capsule) at the medial aspect of the mid-patella.  A blunt kelly clamp was then passed between layers 2 and 3 posteriorly towards the femoral origin of the MPFL, confirming appropriate layer. Using bovie electrocautery, dissection onto the lateral surface of the patella was performed. A rongeur was used to create a trough at the midportion of the patella. Using fluoroscopy, two DX FiberTak all suture anchors were placed into the trough created on the patella. The first was placed at the midportion of the patella in the superior/inferior aspect, and the second was placed ~1.5cm superior near the angle of the patella. Next, Schottle's point was localized with fluoroscopy and an incision was made over it. Dissection was carried down with bovie electrocautery and fascia was incised longitudinally. A guidepin was placed at Schottle's point aiming anteriorly and proximally, and this was brought out the lateral side of the femur. The semitendinosus allograft was prepared by doubling it over and using a FiberLoop over the 78mm of the doubled portion with two free ends of the graft. The doubled over portion measured 7cm in diameter. Therefore, a 64mm low-profile reamer was used to drill over the guidepin in the femur to a depth of 89mm.  Soft tissue was cleared from the medial femoral aperture of this tunnel.  The tails of the FiberLoop were passed through the guidepin and brought out of the lateral aspect of the femur.  The graft was then pulled through into the socket.  An Arthrex 7x34mm BioComposite interference screw was advanced over a nitinol guidewire until flush with the femoral cortex. The graft was pulled and it held firmly in place. A Kelly clamp was passed in between layers 2 and 3 to retreive both free ends of the graft. The graft limbs were held at a resting tension. One limb of the superior patella anchor was passed in a non-locking  [place]ow fashion through one free end of the graft and the second limb was passed through the graft in a simple fashion. This limb became the post and was used to shuttle the graft down to the anchor. This was then tied with alternating half-hitches. Similarly, this was performed for the more inferior suture anchor and the other free end of the graft. This achieved appropriate constraint of the patella. The knee was taken through a range of motion and full hyperextension and 90 degrees flexion or easily achieved. There was no patellar mal-tracking. Patella could not be dislocated laterally.  All wounds were thoroughly irrigated. The deep fascial layer for the medial incision and deeper layers of the medial patellar incision were closed with 0 Vicryl. 2-0 Vicryl was used to close the subdermal layers of both incisions.  Skin was closed with 4-0 Monocryl in a running subcuticular fashion and a layer of Dermabond.  Local anesthetic was injected around both of these incisions. Portal sites were closed with 3-0 Nylon. Xeroform was applied to portal sites. Leg was wrapped in cotton and bias wrap.  Polar Care and hinged knee brace locked at 0 degrees were applied.  The patient was brought to PACU in stable condition.  Of note, services of a PA were essential to performing the surgery. PA was able to assist in patient positioning, exposure, retraction, drilling, and suturing the wound.         DISPOSITION: PACU - hemodynamically stable.  POSTOPERATIVE PLAN: The patient will be discharged home today. The patient will be weightbearing as tolerated with the brace locked in extension. Physical therapy to begin post-op day 3-4 for knee range of motion, patellar mobilization and  scar mobilization.  ASA 325 mg daily x2 weeks for DVT prophylaxis. Return to clinic 10-14 days as scheduled.

## 2018-09-22 NOTE — Anesthesia Preprocedure Evaluation (Signed)
Anesthesia Evaluation  Patient identified by MRN, date of birth, ID band  Reviewed: NPO status   History of Anesthesia Complications (+) PONV and history of anesthetic complications  Airway Mallampati: II  TM Distance: >3 FB Neck ROM: full    Dental no notable dental hx.    Pulmonary asthma (mild) , former smoker,    Pulmonary exam normal        Cardiovascular Exercise Tolerance: Good negative cardio ROS Normal cardiovascular exam     Neuro/Psych  Headaches, ddd negative psych ROS   GI/Hepatic negative GI ROS, Neg liver ROS,   Endo/Other  negative endocrine ROS  Renal/GU negative Renal ROS  negative genitourinary   Musculoskeletal   Abdominal   Peds  Hematology negative hematology ROS (+)   Anesthesia Other Findings   Reproductive/Obstetrics                             Anesthesia Physical Anesthesia Plan  ASA: II  Anesthesia Plan: General LMA and Regional   Post-op Pain Management: GA combined w/ Regional for post-op pain   Induction:   PONV Risk Score and Plan:   Airway Management Planned:   Additional Equipment:   Intra-op Plan:   Post-operative Plan:   Informed Consent: I have reviewed the patients History and Physical, chart, labs and discussed the procedure including the risks, benefits and alternatives for the proposed anesthesia with the patient or authorized representative who has indicated his/her understanding and acceptance.       Plan Discussed with: CRNA  Anesthesia Plan Comments: (Adductor canal block)        Anesthesia Quick Evaluation

## 2018-09-22 NOTE — Anesthesia Procedure Notes (Addendum)
Procedure Name: LMA Insertion Date/Time: 09/22/2018 7:40 AM Performed by: Mayme Genta, CRNA Pre-anesthesia Checklist: Patient identified, Emergency Drugs available, Suction available, Timeout performed and Patient being monitored Patient Re-evaluated:Patient Re-evaluated prior to induction Oxygen Delivery Method: Circle system utilized Preoxygenation: Pre-oxygenation with 100% oxygen Induction Type: IV induction LMA: LMA inserted LMA Size: 4.0 Number of attempts: 1 Placement Confirmation: positive ETCO2 and breath sounds checked- equal and bilateral Tube secured with: Tape

## 2018-09-22 NOTE — H&P (Signed)
Paper H&P to be scanned into permanent record. H&P reviewed. No significant changes noted.  

## 2018-09-22 NOTE — Anesthesia Postprocedure Evaluation (Signed)
Anesthesia Post Note  Patient: Abigail Lopez  Procedure(s) Performed: ARTHROSCOPY KNEE, CHONDROPLASTY AND MPFL RECONSTRUCTION USING SEMITENDINOSUS ALLOGRAFT (Left Knee)  Patient location during evaluation: PACU Anesthesia Type: Regional Level of consciousness: awake and alert Pain management: pain level controlled Vital Signs Assessment: post-procedure vital signs reviewed and stable Respiratory status: spontaneous breathing, nonlabored ventilation, respiratory function stable and patient connected to nasal cannula oxygen Cardiovascular status: blood pressure returned to baseline and stable Postop Assessment: no apparent nausea or vomiting Anesthetic complications: no    [place]ar[place]

## 2018-09-22 NOTE — Progress Notes (Signed)
Assisted [name] with left, ultrasound guided, adductor canal block. Side rails up, monitors on throughout procedure. See vital signs in flow sheet. Tolerated Procedure well.

## 2018-09-22 NOTE — Discharge Instructions (Signed)
Post-Op Instructions  1. Bracing or crutches: You will be provided with a long brace (from hip to ankle) and crutches at the surgery center.   2. Ice: You will be provided with a device [facility]) that allows you to ice the affected area effectively.   3. Showering: Incision must remain dry for 5 days. Afterwards, you may shower and gently pat incision dry. Steri-strips will eventually fall off on their own. NO submerging wound for 4 weeks. There is an absorbable stitch and you may see the ends of the stitch. If applicable, these will be removed at your first post-operative appointment in 2 weeks.   4. Driving: You will be given specific driving precautions at discharge. Plan on not driving for at least one week for left knee surgery, and 4 weeks for right knee surgery if you are restricted due to the brace and knee motion. Please note that you are advised NOT to drive while taking narcotic pain medications as you may be impaired and unsafe to drive.  5. Activity: Weight bearing: Weight bearing as tolerated with brace locked in extension.  Bending the knee is limited and will be guided by the physical therapist. Elolving the knee until cleared by the surgeon or physical therapist.  Avoid long distance travel for 4 weeks.evate knee above heart level as much as possible for one week. Avoid standing more than 5 minutes (consecutively) for the first week. No exercise inv  6. Medications: - You have been provided a prescription for narcotic pain medicine. After surgery, take 1-2 narcotic tablets every 4 hours if needed for severe pain.  - A prescription for anti-nausea medication will be provided in case the narcotic medicine causes nausea - take 1 tablet every 6 hours only if nauseated.  - Take enteric coated aspirin 325 mg once daily for 2 weeks to prevent blood clots.  -Take tylenol 1000 every 8 hours for pain.  May stop tylenol 3 days after surgery or when you are having minimal pain. -Take  ibuprofen 800mg  3x/day with food to help with pain and swelling. Stop taking if you have any GI irritation.    7. Bandages: The physical therapist should change the bandages at the first post-op appointment. If needed, the dressing supplies have been provided to you.  8. Physical Therapy: 2 times per week for the first 4 weeks, then 1-2 times per week from weeks 4-8 post-op. Therapy typically starts on post operative Day 3 or 4. You have been provided an order for physical therapy today and should schedule your appointments in advance to avoid delay. The therapist will provide home exercises.  9. Work or School: For most, but not all procedures, we advise staying out of work or school for at least 1 to 2 weeks in order to recover from the stress of surgery and to allow time for healing and swelling control. If you need a work or school note this can be provided.   10. Post-Op Appointments: Your first post-op appointment will be with Dr. Posey Pronto in approximately 2 weeks time. Please double check if this will be at the [facility] facility (Tuesdays and Thursdays) or [place] facility (Wednesdays).   If you find that they have not been scheduled please call the Orthopaedic Appointment front desk at [phone].     General Anesthesia, Adult, Care After This sheet gives you information about how to care for yourself after your procedure. Your health care provider may also give you more specific instructions. If you have  problems or questions, contact your health care provider. What can I expect after the procedure? After the procedure, the following side effects are common:  Pain or discomfort at the IV site.  Nausea.  Vomiting.  Sore throat.  Trouble concentrating.  Feeling cold or chills.  Weak or tired.  Sleepiness and fatigue.  Soreness and body aches. These side effects can affect parts of the body that were not involved in surgery. Follow these instructions at home:  For at  least 24 hours after the procedure:  Have a responsible adult stay with you. It is important to have someone help care for you until you are awake and alert.  Rest as needed.  Do not: ? Participate in activities in which you could fall or become injured. ? Drive. ? Use heavy machinery. ? Drink alcohol. ? Take sleeping pills or medicines that cause drowsiness. ? Make important decisions or sign legal documents. ? Take care of children on your own. Eating and drinking  Follow any instructions from your health care provider about eating or drinking restrictions.  When you feel hungry, start by eating small amounts of foods that are soft and easy to digest (bland), such as toast. Gradually return to your regular diet.  Drink enough fluid to keep your urine pale yellow.  If you vomit, rehydrate by drinking water, juice, or clear broth. General instructions  If you have sleep apnea, surgery and certain medicines can increase your risk for breathing problems. Follow instructions from your health care provider about wearing your sleep device: ? Anytime you are sleeping, including during daytime naps. ? While taking prescription pain medicines, sleeping medicines, or medicines that make you drowsy.  Return to your normal activities as told by your health care provider. Ask your health care provider what activities are safe for you.  Take over-the-counter and prescription medicines only as told by your health care provider.  If you smoke, do not smoke without supervision.  Keep all follow-up visits as told by your health care provider. This is important. Contact a health care provider if:  You have nausea or vomiting that does not get better with medicine.  You cannot eat or drink without vomiting.  You have pain that does not get better with medicine.  You are unable to pass urine.  You develop a skin rash.  You have a fever.  You have redness around your IV site that gets  worse. Get help right away if:  You have difficulty breathing.  You have chest pain.  You have blood in your urine or stool, or you vomit blood. Summary  After the procedure, it is common to have a sore throat or nausea. It is also common to feel tired.  Have a responsible adult stay with you for the first 24 hours after general anesthesia. It is important to have someone help care for you until you are awake and alert.  When you feel hungry, start by eating small amounts of foods that are soft and easy to digest (bland), such as toast. Gradually return to your regular diet.  Drink enough fluid to keep your urine pale yellow.  Return to your normal activities as told by your health care provider. Ask your health care provider what activities are safe for you. This information is not intended to replace advice given to you by your health care provider. Make sure you discuss any questions you have with your health care provider. Document Released: 10/04/2000 Document Revised: 02/11/2017 Document  Reviewed: 02/11/2017 Elsevier Interactive Patient Education  [employer].

## 2018-09-22 NOTE — Transfer of Care (Signed)
Immediate Anesthesia Transfer of Care Note  Patient: Abigail Lopez  Procedure(s) Performed: ARTHROSCOPY KNEE, CHONDROPLASTY AND MPFL RECONSTRUCTION USING SEMITENDINOSUS ALLOGRAFT (Left Knee)  Patient Location: PACU  Anesthesia Type: General LMA, Regional  Level of Consciousness: awake, alert  and patient cooperative  Airway and Oxygen Therapy: Patient Spontanous Breathing and Patient connected to supplemental oxygen  Post-op Assessment: Post-op Vital signs reviewed, Patient's Cardiovascular Status Stable, Respiratory Function Stable, Patent Airway and No signs of Nausea or vomiting  Post-op Vital Signs: Reviewed and stable  Complications: No apparent anesthesia complications

## 2018-09-25 ENCOUNTER — Encounter: Payer: Self-pay | Admitting: Orthopedic Surgery

## 2018-11-20 ENCOUNTER — Encounter: Payer: Self-pay | Admitting: Orthopedic Surgery

## 2018-12-25 ENCOUNTER — Telehealth: Payer: Self-pay | Admitting: Family Medicine

## 2018-12-25 DIAGNOSIS — Z1211 Encounter for screening for malignant neoplasm of colon: Secondary | ICD-10-CM

## 2018-12-25 NOTE — Telephone Encounter (Signed)
Patient would like to request a referral be sent to Dr Maudie Mercury office in [place] to have a colonoscopy done. Patient stated at last visit this was discussed but patient ended up having surgery and wanted to wait     BEST PHONE- [phone]

## 2019-01-22 ENCOUNTER — Telehealth: Payer: Self-pay | Admitting: Gastroenterology

## 2019-01-22 NOTE — Telephone Encounter (Signed)
Patient called &l/m  to schedule her colonoscopy. In work-q

## 2019-01-24 NOTE — Telephone Encounter (Signed)
Colonoscopy has been scheduled for 02/01/19 at [facility] with Dr. Vicente Males.    Thanks [employer]

## 2019-01-25 ENCOUNTER — Telehealth: Payer: Self-pay

## 2019-01-25 ENCOUNTER — Encounter: Payer: Self-pay | Admitting: *Deleted

## 2019-01-25 ENCOUNTER — Other Ambulatory Visit: Payer: Self-pay

## 2019-01-25 DIAGNOSIS — Z1211 Encounter for screening for malignant neoplasm of colon: Secondary | ICD-10-CM

## 2019-01-25 MED ORDER — NA SULFATE-K SULFATE-MG SULF 17.5-3.13-1.6 GM/177ML PO SOLN: 1.0000 | Freq: Once | ORAL | 0 refills | Status: AC

## 2019-01-25 NOTE — Telephone Encounter (Signed)
Gastroenterology Pre-Procedure Review  Request Date: 02/01/19 Requesting Physician: Dr. Allen Norris  PATIENT REVIEW QUESTIONS: The patient responded to the following health history questions as indicated:    1. Are you having any GI issues? no 2. Do you have a personal history of Polyps? yes (around 2014) 3. Do you have a family history of Colon Cancer or Polyps? no 4. Diabetes Mellitus? no 5. Joint replacements in the past 12 months?yes (March 13th Knee Surgery) 6. Major health problems in the past 3 months?no 7. Any artificial heart valves, MVP, or defibrillator?no    MEDICATIONS & ALLERGIES:    Patient reports the following regarding taking any anticoagulation/antiplatelet therapy:   Plavix, Coumadin, Eliquis, Xarelto, Lovenox, Pradaxa, Brilinta, or Effient? no Aspirin? no  Patient confirms/reports the following medications:  Current Outpatient Medications  Medication Sig Dispense Refill  . acetaminophen (TYLENOL) 500 MG tablet Take 2 tablets (1,000 mg total) by mouth every 8 (eight) hours. 90 tablet 2  . albuterol (VENTOLIN HFA) 108 (90 Base) MCG/ACT inhaler Inhale 1-2 puffs into the lungs every 6 (six) hours as needed for wheezing or shortness of breath.    . cetirizine (ZYRTEC) 10 MG tablet Take 10 mg by mouth as needed for allergies.    . cyanocobalamin (,VITAMIN B-12,) 1000 MCG/ML injection Inect 1053mg IM every 30 days 1 mL 11  . Na Sulfate-K Sulfate-Mg Sulf 17.5-3.13-1.6 GM/177ML SOLN Take 1 kit by mouth once for 1 dose. 354 mL 0  . ondansetron (ZOFRAN ODT) 4 MG disintegrating tablet Take 1 tablet (4 mg total) by mouth every 8 (eight) hours as needed for nausea or vomiting. 20 tablet 0  . traMADol (ULTRAM) 50 MG tablet Take 1 tablet (50 mg total) by mouth every 4 (four) hours as needed. 30 tablet 0  . VITAMIN D, CHOLECALCIFEROL, PO Take by mouth. 4000 units daily     No current facility-administered medications for this visit.     Patient confirms/reports the following  allergies:  Allergies  Allergen Reactions  . Atarax [Hydroxyzine] Hives and Itching  . Hydrocodone Hives and Itching  . Other Hives and Itching  . Oxycodone Itching  . Tandearil [Oxyphenbutazone] Hives and Itching  . Benadryl [Diphenhydramine] Rash    No orders of the defined types were placed in this encounter.   AUTHORIZATION INFORMATION Primary Insurance: 1D#: Group #:  Secondary Insurance: 1D#: Group #:  SCHEDULE INFORMATION: Date: 02/01/19 Time: Location:[facility]

## 2019-01-29 ENCOUNTER — Other Ambulatory Visit: Payer: Self-pay

## 2019-01-29 ENCOUNTER — Other Ambulatory Visit
Admission: RE | Admit: 2019-01-29 | Discharge: 2019-01-29 | Disposition: A | Discharge status: Home / Self Care | Payer: HRSA Program | Source: Ambulatory Visit | Attending: Gastroenterology | Admitting: Gastroenterology

## 2019-01-29 DIAGNOSIS — Z1159 Encounter for screening for other viral diseases: Secondary | ICD-10-CM | POA: Insufficient documentation

## 2019-01-30 LAB — SARS CORONAVIRUS 2 (TAT 6-24 HRS): SARS Coronavirus 2: NEGATIVE

## 2019-01-31 ENCOUNTER — Encounter: Payer: Self-pay | Admitting: *Deleted

## 2019-01-31 NOTE — Discharge Instructions (Signed)

## 2019-01-31 NOTE — Anesthesia Preprocedure Evaluation (Addendum)
Anesthesia Evaluation    History of Anesthesia Complications (+) PONV and history of anesthetic complications  Airway Mallampati: I  TM Distance: >3 FB Neck ROM: full    Dental  (+) Dental Advidsory Given, Caps, Teeth Intact   Pulmonary neg shortness of breath, asthma , neg COPD, neg recent URI, former smoker (quit 1984),    Pulmonary exam normal        Cardiovascular Exercise Tolerance: Good negative cardio ROS Normal cardiovascular exam     Neuro/Psych  Headaches, neg Seizures DDD    GI/Hepatic negative GI ROS, Neg liver ROS,   Endo/Other  negative endocrine ROS  Renal/GU negative Renal ROS     Musculoskeletal   Abdominal   Peds  Hematology negative hematology ROS (+)   Anesthesia Other Findings Past Medical History: 1983: Asthma January 2012: Atypical ductal hyperplasia of left breast     Comment:  Atypical lobular hyperplasia on stereo biopsy, no               additional abnormality on wide excision. 2012: Atypical lobular hyperplasia of left breast     Comment:  Present on stereo biopsy, no additional pathology on               wide excision. 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 No date: PONV (postoperative nausea and vomiting)   Reproductive/Obstetrics                            Anesthesia Physical Anesthesia Plan  ASA: II  Anesthesia Plan: General   Post-op Pain Management:    Induction: Intravenous  PONV Risk Score and Plan: 4 or greater and TIVA and Propofol infusion  Airway Management Planned: Natural Airway and Nasal Cannula  Additional Equipment:   Intra-op Plan:   Post-operative Plan:   Informed Consent: I have reviewed the patients History and Physical, chart, labs and discussed the procedure including the risks, benefits and  alternatives for the proposed anesthesia with the patient or authorized representative who has indicated his/her understanding and acceptance.     Dental Advisory Given  Plan Discussed with:   Anesthesia Plan Comments:        Anesthesia Quick Evaluation

## 2019-02-01 ENCOUNTER — Encounter: Payer: Self-pay | Admitting: Anesthesiology

## 2019-02-01 ENCOUNTER — Encounter
Admission: RE | Disposition: A | Discharge status: Home / Self Care | Payer: Self-pay | Source: Home / Self Care | Attending: Gastroenterology

## 2019-02-01 ENCOUNTER — Other Ambulatory Visit: Payer: Self-pay

## 2019-02-01 ENCOUNTER — Ambulatory Visit: Admit: 2019-02-01 | Payer: Self-pay | Admitting: Gastroenterology

## 2019-02-01 ENCOUNTER — Ambulatory Visit
Admission: RE | Admit: 2019-02-01 | Discharge: 2019-02-01 | Disposition: A | Discharge status: Home / Self Care | Payer: Self-pay | Attending: Gastroenterology | Admitting: Gastroenterology

## 2019-02-01 DIAGNOSIS — Z886 Allergy status to analgesic agent status: Secondary | ICD-10-CM | POA: Insufficient documentation

## 2019-02-01 DIAGNOSIS — Z888 Allergy status to other drugs, medicaments and biological substances status: Secondary | ICD-10-CM | POA: Insufficient documentation

## 2019-02-01 DIAGNOSIS — K640 First degree hemorrhoids: Secondary | ICD-10-CM | POA: Insufficient documentation

## 2019-02-01 DIAGNOSIS — Z1211 Encounter for screening for malignant neoplasm of colon: Secondary | ICD-10-CM | POA: Insufficient documentation

## 2019-02-01 DIAGNOSIS — Z87891 Personal history of nicotine dependence: Secondary | ICD-10-CM | POA: Insufficient documentation

## 2019-02-01 DIAGNOSIS — Z885 Allergy status to narcotic agent status: Secondary | ICD-10-CM | POA: Insufficient documentation

## 2019-02-01 DIAGNOSIS — J45909 Unspecified asthma, uncomplicated: Secondary | ICD-10-CM | POA: Insufficient documentation

## 2019-02-01 HISTORY — PX: COLONOSCOPY WITH PROPOFOL: SHX5780

## 2019-02-01 SURGERY — COLONOSCOPY WITH PROPOFOL
Anesthesia: General

## 2019-02-01 MED ORDER — PROPOFOL 10 MG/ML IV BOLUS
INTRAVENOUS | Status: DC | PRN
  Administered 2019-02-01: 50 mg via INTRAVENOUS
  Administered 2019-02-01: 30 mg via INTRAVENOUS

## 2019-02-01 MED ORDER — PROPOFOL 500 MG/50ML IV EMUL
INTRAVENOUS | Status: DC | PRN
  Administered 2019-02-01: 150 ug/kg/min via INTRAVENOUS

## 2019-02-01 MED ORDER — PROPOFOL 500 MG/50ML IV EMUL
INTRAVENOUS | Status: AC
  Filled 2019-02-01: qty 50

## 2019-02-01 MED ORDER — SODIUM CHLORIDE 0.9 % IV SOLN
INTRAVENOUS | Status: DC
  Administered 2019-02-01: 08:00:00 via INTRAVENOUS

## 2019-02-01 NOTE — Op Note (Signed)
[facility] Gastroenterology Patient Name: Abigail Lopez Procedure Date: 02/01/2019 7:21 AM MRN: [id number] Account #: [id number] Date of Birth: [birth year] Admit Type: Outpatient Age: 58 Room: [facility] ENDO ROOM 4 Gender: Female Note Status: Finalized Procedure:            Colonoscopy Indications:          Screening for colorectal malignant neoplasm Providers:            [name] MD, MD Referring MD:         [name] MD, MD (Referring MD) Medicines:            Propofol per Anesthesia Complications:        No immediate complications. Procedure:            Pre-Anesthesia Assessment:                       - Prior to the procedure, a History and Physical was                        performed, and patient medications and allergies were                        reviewed. The patient's tolerance of previous                        anesthesia was also reviewed. The risks and benefits of                        the procedure and the sedation options and risks were                        discussed with the patient. All questions were                        answered, and informed consent was obtained. Prior                        Anticoagulants: The patient has taken no previous                        anticoagulant or antiplatelet agents. ASA Grade                        Assessment: II - A patient with mild systemic disease.                        After reviewing the risks and benefits, the patient was                        deemed in satisfactory condition to undergo the                        procedure.                       After obtaining informed consent, the colonoscope was                        passed under direct vision. Throughout the procedure,  the patient's blood pressure, pulse, and oxygen                        saturations were monitored continuously. The                        Colonoscope was introduced through the anus and                 advanced to the the cecum, identified by appendiceal                        orifice and ileocecal valve. The colonoscopy was                        performed without difficulty. The patient tolerated the                        procedure well. The quality of the bowel preparation                        was excellent. Findings:      The perianal and digital rectal examinations were normal.      Non-bleeding internal hemorrhoids were found during retroflexion. The       hemorrhoids were Grade I (internal hemorrhoids that do not prolapse). Impression:           - Non-bleeding internal hemorrhoids.                       - No specimens collected. Recommendation:       - Discharge patient to home.                       - Resume previous diet.                       - Continue present medications.                       - Repeat colonoscopy in 10 years for screening unless                        any change in family history or lower GI problems. Procedure Code(s):    --- Professional ---                       [phone], Colonoscopy, flexible; diagnostic, including                        collection of specimen(s) by brushing or washing, when                        performed (separate procedure) Diagnosis Code(s):    --- Professional ---                       Z12.11, Encounter for screening for malignant neoplasm                        of colon CPT copyright 2019 American Medical Association. All rights reserved. The codes documented in this report are preliminary and upon coder review may  be revised to meet current compliance requirements.  [name] MD, MD 02/01/2019 7:57:10 AM This report has been signed electronically. Number of Addenda: 0 Note Initiated On: 02/01/2019 7:21 AM Scope Withdrawal Time: 0 hours 8 minutes 23 seconds  Total Procedure Duration: 0 hours 11 minutes 57 seconds  Estimated Blood Loss: Estimated blood loss: none.      [facility]

## 2019-02-01 NOTE — H&P (Signed)
[name], MD [name] [name], [name] [address] Phone: [phone] Fax : [phone]  Primary Care Physician:  [name], MD Primary Gastroenterologist:  Dr. Allen Norris  Pre-Procedure History & Physical: HPI:  [name] is a 58 y.o. female is here for a screening colonoscopy.   Past Medical History:  Diagnosis Date  . Asthma 1983  . 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  Marland Kitchen Atypical lobular hyperplasia of left breast 2012   Present on stereo biopsy, no additional pathology on wide excision.  . DDD (degenerative disc disease)   . High cholesterol   . History of chicken pox   . Migraine   . Neoplasm of uncertain behavior of breast 2013  . PONV (postoperative nausea and vomiting)     Past Surgical History:  Procedure Laterality Date  . BREAST MASS EXCISION Left 2012  . CESAREAN SECTION  1995  . COLONOSCOPY  2014   [facility]  . DILATION AND CURETTAGE OF UTERUS  2011  . GANGLION CYST EXCISION  2001  . KNEE ARTHROSCOPY Left 09/22/2018   Procedure: ARTHROSCOPY KNEE, CHONDROPLASTY AND MPFL RECONSTRUCTION USING SEMITENDINOSUS ALLOGRAFT;  Surgeon: Leim Fabry, MD;  Location: [place];  Service: Orthopedics;  Laterality: Left;  TODD MUNDY ASSISTING ARTHREX MPFL INTERFERENCE SCREWS + DX [place] C ARM SEMII TENDINOSUS ALLOGRAFT  . KNEE SURGERY Left 1978    Prior to Admission medications   Medication Sig Start Date End Date Taking? Authorizing Provider  cetirizine (ZYRTEC) 10 MG tablet Take 10 mg by mouth as needed for allergies.   Yes [provider]  acetaminophen (TYLENOL) 500 MG tablet Take 2 tablets (1,000 mg total) by mouth every 8 (eight) hours. Patient not taking: Reported on 01/25/2019 09/22/18 09/22/19  Leim Fabry, MD  albuterol (VENTOLIN HFA) 108 (90 Base) MCG/ACT inhaler Inhale 1-2 puffs into the lungs every 6  (six) hours as needed for wheezing or shortness of breath.    [provider]  Ascorbic Acid (VITAMIN C PO) Take by mouth.    [provider]  cyanocobalamin (,VITAMIN B-12,) 1000 MCG/ML injection Inect 1058mcg IM every 30 days Patient not taking: Reported on 01/25/2019 03/28/18   [name], MD  ondansetron (ZOFRAN ODT) 4 MG disintegrating tablet Take 1 tablet (4 mg total) by mouth every 8 (eight) hours as needed for nausea or vomiting. Patient not taking: Reported on 01/25/2019 09/22/18   Leim Fabry, MD  traMADol (ULTRAM) 50 MG tablet Take 1 tablet (50 mg total) by mouth every 4 (four) hours as needed. Patient not taking: Reported on 01/25/2019 09/22/18 09/22/19  Leim Fabry, MD  TURMERIC PO Take 1,000 mg by mouth.    [provider]  VITAMIN D, CHOLECALCIFEROL, PO Take by mouth. 4000 units daily    [provider]    Allergies as of 01/25/2019 - Review Complete 01/25/2019  Allergen Reaction Noted  . Atarax [hydroxyzine] Hives and Itching 09/21/2012  . Hydrocodone Hives and Itching 05/02/2016  . Other Hives and Itching 06/12/2014  . Oxycodone Itching 02/29/2016  . Tandearil [oxyphenbutazone] Hives and Itching 09/21/2012  . Benadryl [diphenhydramine] Rash 05/02/2016    Family History  Problem Relation Age of Onset  . Cancer Mother 57       breast   . Liver cancer Mother 48  . Ovarian cancer Paternal Grandmother   . Arthritis Paternal Grandmother   . Colon  cancer Father   . Arthritis Father   . Hyperlipidemia Father   . Heart disease Father   . Stroke Sister   . Heart disease Paternal Grandfather     Social History   Socioeconomic History  . Marital status: Married    Spouse name: Not on file  . Number of children: Not on file  . Years of education: Not on file  . Highest education level: Not on file  Occupational History  . Not on file  Social Needs  . Financial resource strain: Not on file  . Food insecurity    Worry: Not on file     Inability: Not on file  . Transportation needs    Medical: Not on file    Non-medical: Not on file  Tobacco Use  . Smoking status: Former Smoker    Packs/day: 1.00    Years: 4.00    Pack years: 4.00    Types: Cigarettes    Quit date: 1984    Years since quitting: 36.5  . Smokeless tobacco: Never Used  . Tobacco comment: smoked  college  Substance and Sexual Activity  . Alcohol use: No    Alcohol/week: 0.0 standard drinks  . Drug use: No  . Sexual activity: Not on file  Lifestyle  . Physical activity    Days per week: Not on file    Minutes per session: Not on file  . Stress: Not on file  Relationships  . Social Herbalist on phone: Not on file    Gets together: Not on file    Attends religious service: Not on file    Active member of club or organization: Not on file    Attends meetings of clubs or organizations: Not on file    Relationship status: Not on file  . Intimate partner violence    Fear of current or ex partner: Not on file    Emotionally abused: Not on file    Physically abused: Not on file    Forced sexual activity: Not on file  Other Topics Concern  . Not on file  Social History Narrative  . Not on file    Review of Systems: See HPI, otherwise negative ROS  Physical Exam: BP (!) 145/85   Pulse 79   Temp 98.8 F (37.1 C) (Oral)   Resp 16   Ht 5\' 3"  (1.6 m)   Wt 56.7 kg   SpO2 100%   BMI 22.14 kg/m  General:   Alert,  pleasant and cooperative in NAD Head:  Normocephalic and atraumatic. Neck:  Supple; no masses or thyromegaly. Lungs:  Clear throughout to auscultation.    Heart:  Regular rate and rhythm. Abdomen:  Soft, nontender and nondistended. Normal bowel sounds, without guarding, and without rebound.   Neurologic:  Alert and  oriented x4;  grossly normal neurologically.  Impression/Plan: Abigail Lopez is now here to undergo a screening colonoscopy.  Risks, benefits, and alternatives regarding colonoscopy have been  reviewed with the patient.  Questions have been answered.  All parties agreeable.

## 2019-02-01 NOTE — Anesthesia Post-op Follow-up Note (Signed)
Anesthesia QCDR form completed.        

## 2019-02-01 NOTE — Transfer of Care (Signed)
Immediate Anesthesia Transfer of Care Note  Patient: Abigail Lopez  Procedure(s) Performed: COLONOSCOPY WITH PROPOFOL (N/A )  Patient Location: PACU  Anesthesia Type:General  Level of Consciousness: awake, alert  and oriented  Airway & Oxygen Therapy: Patient Spontanous Breathing and Patient connected to nasal cannula oxygen  Post-op Assessment: Report given to RN and Post -op Vital signs reviewed and stable  Post vital signs: Reviewed and stable  Last Vitals:  Vitals Value Taken Time  BP 115/70 02/01/19 0800  Temp    Pulse 73 02/01/19 0800  Resp 18 02/01/19 0800  SpO2 100 % 02/01/19 0800  Vitals shown include unvalidated device data.  Last Pain:  Vitals:   02/01/19 0712  TempSrc: Oral  PainSc: 0-No pain         Complications: No apparent anesthesia complications

## 2019-02-01 NOTE — Anesthesia Procedure Notes (Signed)
Date/Time: 02/01/2019 7:45 AM Performed by: Nelda Marseille, CRNA Pre-anesthesia Checklist: Patient identified, Emergency Drugs available, Suction available, Patient being monitored and Timeout performed Oxygen Delivery Method: Nasal cannula

## 2019-02-02 ENCOUNTER — Encounter: Payer: Self-pay | Admitting: Gastroenterology

## 2019-02-05 NOTE — Anesthesia Postprocedure Evaluation (Signed)
Anesthesia Post Note  Patient: Abigail Lopez  Procedure(s) Performed: COLONOSCOPY WITH PROPOFOL (N/A )  Patient location during evaluation: Endoscopy Anesthesia Type: General Level of consciousness: awake and alert Pain management: pain level controlled Vital Signs Assessment: post-procedure vital signs reviewed and stable Respiratory status: spontaneous breathing, nonlabored ventilation, respiratory function stable and patient connected to nasal cannula oxygen Cardiovascular status: blood pressure returned to baseline and stable Postop Assessment: no apparent nausea or vomiting Anesthetic complications: no     Last Vitals:  Vitals:   02/01/19 0811 02/01/19 0819  BP: 128/79 138/75  Pulse: 73 70  Resp: (!) 21 15  Temp:    SpO2: 100% 100%    Last Pain:  Vitals:   02/02/19 0732  TempSrc:   PainSc: 0-No pain                 Martha Clan

## 2019-04-11 ENCOUNTER — Other Ambulatory Visit: Payer: Self-pay

## 2019-04-11 ENCOUNTER — Encounter: Payer: Self-pay | Admitting: Family Medicine

## 2019-04-11 ENCOUNTER — Ambulatory Visit (INDEPENDENT_AMBULATORY_CARE_PROVIDER_SITE_OTHER): Payer: Self-pay | Admitting: Family Medicine

## 2019-04-11 VITALS — BP 122/72 | HR 79 | Temp 97.8°F | Ht 63.0 in | Wt 128.0 lb

## 2019-04-11 DIAGNOSIS — B029 Zoster without complications: Secondary | ICD-10-CM

## 2019-04-11 MED ORDER — ACYCLOVIR 800 MG PO TABS: 800.0000 mg | ORAL_TABLET | Freq: Every day | ORAL | 0 refills | Status: AC

## 2019-04-11 NOTE — Progress Notes (Signed)
Subjective:    Patient ID: Abigail Lopez, female    DOB: 03-04-1961, 58 y.o.   MRN: [id number] Chief Complaint  Patient presents with  . Skin Problem    Pt noticed burning sensation to skin under left breast, radiating around to left back along bra line x 2-3 days. No visible rash. Pt had Shingrix vaccine in Dec/March.   HPI This is 58 yo caucasian female who comes tot he office with complaint of  unilateral left pain from spine to anterior chest cage. Pain started about 3 days ago and got progressively worse. Pt states the pain is 4/10 and has a burning feeling. Pt tried to take Tylenol and Aleve for symptoms relief with moderate success. Pt states that pain is worse with any contact to the skin. Pt denies any skin rash or discoloration.  Pt received Shingrix vaccination in December/March.  Past Medical History:  Diagnosis Date  . Asthma 1983  . 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  Marland Kitchen Atypical lobular hyperplasia of left breast 2012   Present on stereo biopsy, no additional pathology on wide excision.  . DDD (degenerative disc disease)   . High cholesterol   . History of chicken pox   . Migraine   . Neoplasm of uncertain behavior of breast 2013  . PONV (postoperative nausea and vomiting)    Past Surgical History:  Procedure Laterality Date  . BREAST MASS EXCISION Left 2012  . CESAREAN SECTION  1995  . [facility]  [facility]  . COLONOSCOPY WITH PROPOFOL N/A 02/01/2019   Procedure: COLONOSCOPY WITH PROPOFOL;  Surgeon: [name], MD;  Location: [facility] ENDOSCOPY;  Service: Endoscopy;  Laterality: N/A;  . DILATION AND CURETTAGE OF UTERUS  2011  . GANGLION CYST EXCISION  2001  . KNEE ARTHROSCOPY Left 09/22/2018   Procedure: ARTHROSCOPY KNEE, CHONDROPLASTY AND MPFL RECONSTRUCTION USING SEMITENDINOSUS ALLOGRAFT;  Surgeon: Leim Fabry, MD;  Location: [place];  Service: Orthopedics;  Laterality: Left;  TODD MUNDY ASSISTING ARTHREX MPFL INTERFERENCE SCREWS + DX [place] C ARM SEMII TENDINOSUS ALLOGRAFT  . KNEE SURGERY Left 1978   Family History  Problem Relation Age of Onset  . Cancer Mother 14       breast   . Liver cancer Mother 31  . Ovarian cancer Paternal Grandmother   . Arthritis Paternal Grandmother   . Colon cancer Father   . Arthritis Father   . Hyperlipidemia Father   . Heart disease Father   . Stroke Sister   . Heart disease Paternal Grandfather     Review of Systems  Constitutional: Positive for fatigue. Negative for activity change and fever.  Respiratory: Negative for apnea, cough, chest tightness, shortness of breath and wheezing.   Cardiovascular: Negative for chest pain, palpitations and leg swelling.  Gastrointestinal: Negative for abdominal pain, constipation, diarrhea and vomiting.  Musculoskeletal: Positive for back pain and myalgias.       Unilateral from spine to anterior chest cage  Skin: Negative for color change and rash.  Neurological: Negative for dizziness, weakness, light-headedness and headaches.      BP 122/72 (BP Location: Left Arm, Patient Position: Sitting, Cuff Size: Normal)   Pulse 79   Temp 97.8 F (36.6 C) (Temporal)   Ht 5\' 3"  (1.6 m)   Wt 58.1 kg   SpO2 98%   BMI 22.67 kg/m  Objective:   Physical Exam Constitutional:  General: She is not in acute distress.    Appearance: Normal appearance. She is normal weight. She is not ill-appearing.  HENT:     Head: Normocephalic and atraumatic.  Neck:     Musculoskeletal: Normal range of motion.  Cardiovascular:     Rate and Rhythm: Normal rate and regular rhythm.     Pulses: Normal pulses.     Heart sounds: Normal heart sounds.  Pulmonary:     Effort: Pulmonary effort is normal.     Breath sounds: Normal breath sounds.  Abdominal:     General: Bowel sounds are normal.     Palpations: Abdomen is soft.  Musculoskeletal:         General: Tenderness present.  Skin:    General: Skin is warm and dry.     Capillary Refill: Capillary refill takes less than 2 seconds.     Findings: No erythema, lesion or rash.  Neurological:     General: No focal deficit present.     Mental Status: She is alert and oriented to person, place, and time. Mental status is at baseline.  Psychiatric:        Mood and Affect: Mood normal.        Behavior: Behavior normal.        Assessment & Plan:   1. Herpes zoster without complication Pt does not currently have rash, but presents with all symptoms of shingles. Acyclovir prescribed to start early treatment. - acyclovir (ZOVIRAX) 800 MG tablet; Take 1 tablet (800 mg total) by mouth 5 (five) times daily for 7 days.  Dispense: 35 tablet; Refill: 0

## 2019-04-11 NOTE — Patient Instructions (Signed)
Good to see you today  As we discussed, you may have shingles and I have sent in an antiviral medication for you to take  If not better with medication or if symptoms get worse, please be in touch   Shingles  Shingles, which is also known as herpes zoster, is an infection that causes a painful skin rash and fluid-filled blisters. It is caused by a virus. Shingles only develops in people who:  Have had chickenpox.  Have been given a medicine to protect against chickenpox (have been vaccinated). Shingles is rare in this group. What are the causes? Shingles is caused by varicella-zoster virus (VZV). This is the same virus that causes chickenpox. After a person is exposed to VZV, the virus stays in the body in an inactive (dormant) state. Shingles develops if the virus is reactivated. This can happen many years after the first (initial) exposure to VZV. It is not known what causes this virus to be reactivated. What increases the risk? People who have had chickenpox or received the chickenpox vaccine are at risk for shingles. Shingles infection is more common in people who:  Are older than age 35.  Have a weakened disease-fighting system (immune system), such as people with: ? HIV. ? AIDS. ? Cancer.  Are taking medicines that weaken the immune system, such as transplant medicines.  Are experiencing a lot of stress. What are the signs or symptoms? Early symptoms of this condition include itching, tingling, and pain in an area on your skin. Pain may be described as burning, stabbing, or throbbing. A few days or weeks after early symptoms start, a painful red rash appears. The rash is usually on one side of the body and has a band-like or belt-like pattern. The rash eventually turns into fluid-filled blisters that break open, change into scabs, and dry up in about 2-3 weeks. At any time during the infection, you may also develop:  A fever.  Chills.  A headache.  An upset stomach.  How is this diagnosed? This condition is diagnosed with a skin exam. Skin or fluid samples may be taken from the blisters before a diagnosis is made. These samples are examined under a microscope or sent to a lab for testing. How is this treated? The rash may last for several weeks. There is not a specific cure for this condition. Your health care provider will probably prescribe medicines to help you manage pain, recover more quickly, and avoid long-term problems. Medicines may include:  Antiviral drugs.  Anti-inflammatory drugs.  Pain medicines.  Anti-itching medicines (antihistamines). If the area involved is on your face, you may be referred to a specialist, such as an eye doctor (ophthalmologist) or an ear, nose, and throat (ENT) doctor (otolaryngologist) to help you avoid eye problems, chronic pain, or disability. Follow these instructions at home: Medicines  Take over-the-counter and prescription medicines only as told by your health care provider.  Apply an anti-itch cream or numbing cream to the affected area as told by your health care provider. Relieving itching and discomfort   Apply cold, wet cloths (cold compresses) to the area of the rash or blisters as told by your health care provider.  Cool baths can be soothing. Try adding baking soda or dry oatmeal to the water to reduce itching. Do not bathe in hot water. Blister and rash care  Keep your rash covered with a loose bandage (dressing). Wear loose-fitting clothing to help ease the pain of material rubbing against the rash.  Keep  your rash and blisters clean by washing the area with mild soap and cool water as told by your health care provider.  Check your rash every day for signs of infection. Check for: ? More redness, swelling, or pain. ? Fluid or blood. ? Warmth. ? Pus or a bad smell.  Do not scratch your rash or pick at your blisters. To help avoid scratching: ? Keep your fingernails clean and cut short. ?  Wear gloves or mittens while you sleep, if scratching is a problem. General instructions  Rest as told by your health care provider.  Keep all follow-up visits as told by your health care provider. This is important.  Wash your hands often with soap and water. If soap and water are not available, use hand sanitizer. Doing this lowers your chance of getting a bacterial skin infection.  Before your blisters change into scabs, your shingles infection can cause chickenpox in people who have never had it or have never been vaccinated against it. To prevent this from happening, avoid contact with other people, especially: ? Babies. ? Pregnant women. ? Children who have eczema. ? Elderly people who have transplants. ? People who have chronic illnesses, such as cancer or AIDS. Contact a health care provider if:  Your pain is not relieved with prescribed medicines.  Your pain does not get better after the rash heals.  You have signs of infection in the rash area, such as: ? More redness, swelling, or pain around the rash. ? Fluid or blood coming from the rash. ? The rash area feeling warm to the touch. ? Pus or a bad smell coming from the rash. Get help right away if:  The rash is on your face or nose.  You have facial pain, pain around your eye area, or loss of feeling on one side of your face.  You have difficulty seeing.  You have ear pain or have ringing in your ear.  You have a loss of taste.  Your condition gets worse. Summary  Shingles, which is also known as herpes zoster, is an infection that causes a painful skin rash and fluid-filled blisters.  This condition is diagnosed with a skin exam. Skin or fluid samples may be taken from the blisters and examined before the diagnosis is made.  Keep your rash covered with a loose bandage (dressing). Wear loose-fitting clothing to help ease the pain of material rubbing against the rash.  Before your blisters change into scabs,  your shingles infection can cause chickenpox in people who have never had it or have never been vaccinated against it. This information is not intended to replace advice given to you by your health care provider. Make sure you discuss any questions you have with your health care provider. Document Released: 06/28/2005 Document Revised: 10/20/2018 Document Reviewed: 03/02/2017 Elsevier Patient Education  2020 [employer].

## 2019-04-11 NOTE — Progress Notes (Signed)
Subjective:    Patient ID: Abigail Lopez, female    DOB: May 04, 1961, 58 y.o.   MRN: [id number]  HPI Chief Complaint  Patient presents with  . Skin Problem    Pt noticed burning sensation to skin under left breast, radiating around to left back along bra line x 2-3 days. No visible rash. Pt had Shingrix vaccine in Dec/March.   This is a 58 yo female who presents with the above cc. Pain comes and goes, some relief with acetaminophen/ibuprofen.  Skin is very sensitive to anything touching it.  There is burning and tingling.  She has not seen any rash.  Feels overall tired.  She denies fever/chills, cough, nasal symptoms.  Played tennis about 10 days.  She has a history of chickenpox as a child.  She has had Shingrix vaccine x2.  Past Medical History:  Diagnosis Date  . Asthma 1983  . 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  Marland Kitchen Atypical lobular hyperplasia of left breast 2012   Present on stereo biopsy, no additional pathology on wide excision.  . DDD (degenerative disc disease)   . High cholesterol   . History of chicken pox   . Migraine   . Neoplasm of uncertain behavior of breast 2013  . PONV (postoperative nausea and vomiting)    Past Surgical History:  Procedure Laterality Date  . BREAST MASS EXCISION Left 2012  . CESAREAN SECTION  1995  . [facility]  [facility]  . COLONOSCOPY WITH PROPOFOL N/A 02/01/2019   Procedure: COLONOSCOPY WITH PROPOFOL;  Surgeon: [name], MD;  [facility];  Service: Endoscopy;  Laterality: N/A;  . DILATION AND CURETTAGE OF UTERUS  2011  . GANGLION CYST EXCISION  2001  . KNEE ARTHROSCOPY Left 09/22/2018   Procedure: ARTHROSCOPY KNEE, CHONDROPLASTY AND MPFL RECONSTRUCTION USING SEMITENDINOSUS ALLOGRAFT;  Surgeon: Leim Fabry, MD;  Location: [place];  Service: Orthopedics;  Laterality: Left;  TODD MUNDY ASSISTING  ARTHREX MPFL INTERFERENCE SCREWS + DX [place] C ARM SEMII TENDINOSUS ALLOGRAFT  . KNEE SURGERY Left 1978   Family History  Problem Relation Age of Onset  . Cancer Mother 5       breast   . Liver cancer Mother 7  . Ovarian cancer Paternal Grandmother   . Arthritis Paternal Grandmother   . Colon cancer Father   . Arthritis Father   . Hyperlipidemia Father   . Heart disease Father   . Stroke Sister   . Heart disease Paternal Grandfather    Social History   Tobacco Use  . Smoking status: Former Smoker    Packs/day: 1.00    Years: 4.00    Pack years: 4.00    Types: Cigarettes    Quit date: 1984    Years since quitting: 36.7  . Smokeless tobacco: Never Used  . Tobacco comment: smoked  college  Substance Use Topics  . Alcohol use: No    Alcohol/week: 0.0 standard drinks  . Drug use: No      Review of Systems Per HPI    Objective:   Physical Exam Vitals signs reviewed.  Constitutional:      General: She is not in acute distress.    Appearance: Normal appearance. She is normal weight. She is not ill-appearing, toxic-appearing or diaphoretic.  HENT:     Head: Normocephalic and atraumatic.  Eyes:     Conjunctiva/sclera: Conjunctivae normal.  Cardiovascular:     Rate and Rhythm: Normal rate.  Pulmonary:     Effort: Pulmonary effort is normal.  Skin:    General: Skin is warm and dry.     Findings: No bruising, lesion or rash.     Comments: Area of concern appears unremarkable.  She does have generally mottled skin with multiple chronic appearing macules and moles.  There is no ecchymosis, erythema or new appearing lesions.  Neurological:     Mental Status: She is alert and oriented to person, place, and time.  Psychiatric:        Mood and Affect: Mood normal.        Behavior: Behavior normal.        Thought Content: Thought content normal.        Judgment: Judgment normal.       BP 122/72 (BP Location: Left Arm, Patient Position: Sitting, Cuff Size:  Normal)   Pulse 79   Temp 97.8 F (36.6 C) (Temporal)   Ht 5\' 3"  (1.6 m)   Wt 128 lb (58.1 kg)   SpO2 98%   BMI 22.67 kg/m      Assessment & Plan:  1. Herpes zoster without complication -Suspicious for shingles with symptoms.  Overly will be a mild case since she has been immunized.  We will go ahead and start antiviral. -Patient was instructed to notify office if rash appears or if she develops any new symptoms - acyclovir (ZOVIRAX) 800 MG tablet; Take 1 tablet (800 mg total) by mouth 5 (five) times daily for 7 days.  Dispense: 35 tablet; Refill: 0   Clarene Reamer, FNP-BC  [facility]  04/12/2019 8:25 AM

## 2020-01-15 ENCOUNTER — Other Ambulatory Visit: Payer: Self-pay | Admitting: General Surgery

## 2020-01-15 DIAGNOSIS — N644 Mastodynia: Secondary | ICD-10-CM

## 2020-01-17 ENCOUNTER — Other Ambulatory Visit: Payer: Self-pay | Admitting: General Surgery

## 2020-01-17 DIAGNOSIS — N644 Mastodynia: Secondary | ICD-10-CM

## 2020-01-25 NOTE — Progress Notes (Signed)
Patient phoned requesting assistance with scheduling diagnostic mammogram.  She has limited insurance, but does not qualify for BCCCP.  Message sent to Eino Farber at [facility] to assess [employer] assistance, and to schedule mammogram.

## 2020-01-31 ENCOUNTER — Ambulatory Visit
Admission: RE | Admit: 2020-01-31 | Discharge: 2020-01-31 | Disposition: A | Discharge status: Home / Self Care | Payer: Self-pay | Source: Ambulatory Visit | Attending: General Surgery | Admitting: General Surgery

## 2020-01-31 ENCOUNTER — Other Ambulatory Visit: Payer: Self-pay

## 2020-01-31 DIAGNOSIS — N644 Mastodynia: Secondary | ICD-10-CM | POA: Insufficient documentation

## 2020-05-27 ENCOUNTER — Telehealth: Payer: Self-pay

## 2020-05-27 ENCOUNTER — Other Ambulatory Visit: Payer: Self-pay

## 2020-05-27 ENCOUNTER — Ambulatory Visit: Payer: Self-pay | Admitting: Family Medicine

## 2020-05-27 ENCOUNTER — Encounter: Payer: Self-pay | Admitting: Family Medicine

## 2020-05-27 VITALS — BP 130/80 | HR 72 | Temp 98.2°F | Wt 134.8 lb

## 2020-05-27 DIAGNOSIS — E7800 Pure hypercholesterolemia, unspecified: Secondary | ICD-10-CM

## 2020-05-27 DIAGNOSIS — B029 Zoster without complications: Secondary | ICD-10-CM

## 2020-05-27 DIAGNOSIS — Z23 Encounter for immunization: Secondary | ICD-10-CM

## 2020-05-27 MED ORDER — VALACYCLOVIR HCL 1 G PO TABS: 1000.0000 mg | ORAL_TABLET | Freq: Three times a day (TID) | ORAL | 0 refills | Status: DC

## 2020-05-27 NOTE — Telephone Encounter (Signed)
See note from today

## 2020-05-27 NOTE — Assessment & Plan Note (Signed)
Pt with nerve pain in area of prior shingles outbreak. Discussed this may be a prodrome to the rash. Advised watchful waiting and if rash develops to start Valacyclovir immediately.

## 2020-05-27 NOTE — Assessment & Plan Note (Signed)
Reviewed prior labs. Elevated. Has not seen PCP in >1-2 years. Advised she schedule annual physical to recheck labs.

## 2020-05-27 NOTE — Patient Instructions (Signed)
Consider starting treatment tomorrow.   If no rash or not improved, recommend follow-up appointment

## 2020-05-27 NOTE — Progress Notes (Signed)
Subjective:     Abigail Lopez is a 59 y.o. female presenting for Flank Pain (L side, from under breast to the back. No rash.  Hx of shingles)     HPI  #Shingles concern - nerve pain at the same location of her prior shingles reaction - has been vaccinated, but had a previous break through vaccine episode but very minimal course - pain is located under the breast on the left side and follows along to the back  - no cp, no fever/chills  Review of Systems   Social History   Tobacco Use  Smoking Status Former Smoker  . Packs/day: 1.00  . Years: 4.00  . Pack years: 4.00  . Types: Cigarettes  . Quit date: 34  . Years since quitting: 37.9  Smokeless Tobacco Never Used  Tobacco Comment   smoked  college        Objective:    BP Readings from Last 3 Encounters:  05/27/20 130/80  04/11/19 122/72  02/01/19 138/75   Wt Readings from Last 3 Encounters:  05/27/20 134 lb 12 oz (61.1 kg)  04/11/19 128 lb (58.1 kg)  02/01/19 125 lb (56.7 kg)    BP 130/80   Pulse 72   Temp 98.2 F (36.8 C) (Temporal)   Wt 134 lb 12 oz (61.1 kg)   SpO2 100%   BMI 23.87 kg/m    Physical Exam Constitutional:      General: She is not in acute distress.    Appearance: She is well-developed. She is not diaphoretic.  HENT:     Right Ear: External ear normal.     Left Ear: External ear normal.  Eyes:     Conjunctiva/sclera: Conjunctivae normal.  Cardiovascular:     Rate and Rhythm: Normal rate.  Pulmonary:     Effort: Pulmonary effort is normal.  Musculoskeletal:     Cervical back: Neck supple.  Skin:    General: Skin is warm and dry.     Capillary Refill: Capillary refill takes less than 2 seconds.     Comments: Skin under the left breast and around the back examined w/o erythema or lesions. Mild nerve pain started with palpation.   Neurological:     Mental Status: She is alert. Mental status is at baseline.  Psychiatric:        Mood and Affect: Mood normal.         Behavior: Behavior normal.           Assessment & Plan:   Problem List Items Addressed This Visit      Other   High cholesterol    Reviewed prior labs. Elevated. Has not seen PCP in >1-2 years. Advised she schedule annual physical to recheck labs.       Herpes zoster without complication - Primary    Pt with nerve pain in area of prior shingles outbreak. Discussed this may be a prodrome to the rash. Advised watchful waiting and if rash develops to start Valacyclovir immediately.       Relevant Medications   valACYclovir (VALTREX) 1000 MG tablet    Other Visit Diagnoses    Need for influenza vaccination       Relevant Orders   Flu Vaccine QUAD 36+ mos IM (Completed)       Return if symptoms worsen or fail to improve.  Lesleigh Noe, MD  This visit occurred during the SARS-CoV-2 public health emergency.  Safety protocols were in place, including screening questions  prior to the visit, additional usage of staff PPE, and extensive cleaning of exam room while observing appropriate contact time as indicated for disinfecting solutions.

## 2020-05-27 NOTE — Telephone Encounter (Signed)
[place] Night - Client Nonclinical Telephone Record AccessNurse Client [facility] Night - Client Client Site [place] - Night Physician Eliezer Lofts - MD Contact Type Call Who Is Calling Patient / Member / Family / Caregiver Caller Name [place] Phone Number [phone] Patient Name Abigail Lopez Patient DOB 04/17/1961 Call Type Message Only Information Provided Reason for Call Medication Question / Request Initial Comment caller is fully vaccinated but has also been diagnosed and treated for Shingles - she has medication from the last outbreak and she's wanting to know can she take that or will a new script have to be called in [place]. Time Disposition Final User 05/27/2020 7:56:26 AM General Information Provided Yes Kenton Kingfisher, Lanette Call Closed By: Nelia Shi Transaction Date/Time: 05/27/2020 7:53:07 AM (ET)

## 2020-05-27 NOTE — Telephone Encounter (Signed)
I spoke with pt and pt does not have rash or blisters but has nerve pain under left breast at bra line and goes around to the back. Pt said pain started on 05/26/20. Pt said had shingles last September and symptoms started the same way and then broke out with blister like rash.Pt scheduled appt with Dr Einar Pheasant 05/27/20 at 4 PM.(pt is at work and had to make coverage arrangements).

## 2020-05-28 MED ORDER — ACYCLOVIR 800 MG PO TABS: 800.0000 mg | ORAL_TABLET | Freq: Every day | ORAL | 0 refills | Status: AC

## 2020-07-10 ENCOUNTER — Other Ambulatory Visit: Payer: Self-pay

## 2020-07-10 DIAGNOSIS — Z20822 Contact with and (suspected) exposure to covid-19: Secondary | ICD-10-CM

## 2020-07-12 LAB — SARS-COV-2, NAA 2 DAY TAT

## 2020-07-12 LAB — NOVEL CORONAVIRUS, NAA: SARS-CoV-2, NAA: NOT DETECTED

## 2020-07-17 ENCOUNTER — Encounter: Payer: Self-pay | Admitting: Family Medicine

## 2020-08-22 ENCOUNTER — Other Ambulatory Visit: Payer: Self-pay

## 2020-08-22 ENCOUNTER — Other Ambulatory Visit (HOSPITAL_COMMUNITY)
Admission: RE | Admit: 2020-08-22 | Discharge: 2020-08-22 | Disposition: A | Discharge status: Home / Self Care | Payer: Self-pay | Source: Ambulatory Visit | Attending: Family Medicine | Admitting: Family Medicine

## 2020-08-22 ENCOUNTER — Encounter: Payer: Self-pay | Admitting: Family Medicine

## 2020-08-22 ENCOUNTER — Ambulatory Visit: Payer: Self-pay | Admitting: Family Medicine

## 2020-08-22 VITALS — BP 140/90 | HR 68 | Temp 98.0°F | Ht 62.75 in | Wt 128.0 lb

## 2020-08-22 DIAGNOSIS — R5383 Other fatigue: Secondary | ICD-10-CM

## 2020-08-22 DIAGNOSIS — R030 Elevated blood-pressure reading, without diagnosis of hypertension: Secondary | ICD-10-CM

## 2020-08-22 DIAGNOSIS — E7800 Pure hypercholesterolemia, unspecified: Secondary | ICD-10-CM

## 2020-08-22 DIAGNOSIS — Z124 Encounter for screening for malignant neoplasm of cervix: Secondary | ICD-10-CM

## 2020-08-22 DIAGNOSIS — J4520 Mild intermittent asthma, uncomplicated: Secondary | ICD-10-CM

## 2020-08-22 DIAGNOSIS — Z0000 Encounter for general adult medical examination without abnormal findings: Secondary | ICD-10-CM

## 2020-08-22 DIAGNOSIS — E538 Deficiency of other specified B group vitamins: Secondary | ICD-10-CM

## 2020-08-22 DIAGNOSIS — E559 Vitamin D deficiency, unspecified: Secondary | ICD-10-CM

## 2020-08-22 LAB — CBC WITH DIFFERENTIAL/PLATELET
Basophils Absolute: 0 10*3/uL (ref 0.0–0.1)
Basophils Relative: 0.8 % (ref 0.0–3.0)
Eosinophils Absolute: 0.2 10*3/uL (ref 0.0–0.7)
Eosinophils Relative: 2.6 % (ref 0.0–5.0)
HCT: 41.4 % (ref 36.0–46.0)
Hemoglobin: 14 g/dL (ref 12.0–15.0)
Lymphocytes Relative: 38.5 % (ref 12.0–46.0)
Lymphs Abs: 2.5 10*3/uL (ref 0.7–4.0)
MCHC: 33.8 g/dL (ref 30.0–36.0)
MCV: 86.8 fl (ref 78.0–100.0)
Monocytes Absolute: 0.5 10*3/uL (ref 0.1–1.0)
Monocytes Relative: 7.1 % (ref 3.0–12.0)
Neutro Abs: 3.3 10*3/uL (ref 1.4–7.7)
Neutrophils Relative %: 51 % (ref 43.0–77.0)
Platelets: 281 10*3/uL (ref 150.0–400.0)
RBC: 4.77 Mil/uL (ref 3.87–5.11)
RDW: 13.8 % (ref 11.5–15.5)
WBC: 6.5 10*3/uL (ref 4.0–10.5)

## 2020-08-22 LAB — TSH: TSH: 1.98 u[IU]/mL (ref 0.35–4.50)

## 2020-08-22 MED ORDER — ALBUTEROL SULFATE HFA 108 (90 BASE) MCG/ACT IN AERS
1.0000 | INHALATION_SPRAY | Freq: Four times a day (QID) | RESPIRATORY_TRACT | 0 refills | Status: AC | PRN
Start: 2020-08-22 — End: ?

## 2020-08-22 NOTE — Assessment & Plan Note (Signed)
BPs 120/70s consistently at home.

## 2020-08-22 NOTE — Assessment & Plan Note (Addendum)
Due for re-eval.   On supplement.

## 2020-08-22 NOTE — Assessment & Plan Note (Signed)
Due for re-eval.  On supplement.

## 2020-08-22 NOTE — Addendum Note (Signed)
Addended by: Carter Kitten on: 08/22/2020 09:37 AM   Modules accepted: Orders

## 2020-08-22 NOTE — Patient Instructions (Addendum)
Please stop at the lab to have labs drawn.  Keep up the great work on healthy eating and exercise.

## 2020-08-22 NOTE — Assessment & Plan Note (Signed)
Due for re-eval. 

## 2020-08-22 NOTE — Progress Notes (Signed)
Patient ID: Arta Bruce, female    DOB: 1960/09/29, 60 y.o.   MRN: [id number]  This visit was conducted in person.  BP 140/90   Pulse 68   Temp 98 F (36.7 C) (Temporal)   Ht 5' 2.75" (1.594 m)   Wt 128 lb (58.1 kg)   LMP 09/23/2017 (Approximate) Comment:    SpO2 98%   BMI 22.86 kg/m    CC:  Chief Complaint  Patient presents with  . Annual Exam    Subjective:   HPI: BEATRIX BREECE is a 60 y.o. female presenting on 08/22/2020 for Annual Exam   09/2018 left knee surgery for injury.  Elevated Cholesterol: Due for re-eval Using medications without problems: Muscle aches:  Diet compliance: Exercise: Other complaints:  Mild intermittent allergic asthma:  Intermittent .Marland Kitchen needs albuterol prn.   Vit B12 def: Due for re-eval   vit D def:  Due for re-eval She is taking vit D 10,000 three times a week.     BP border line today.. but at home 120/70.   exercise: tennis and pickle ball  diet: healthy diet, whole grains, veggies.  Relevant past medical, surgical, family and social history reviewed and updated as indicated. Interim medical history since our last visit reviewed. Allergies and medications reviewed and updated. Outpatient Medications Prior to Visit  Medication Sig Dispense Refill  . albuterol (VENTOLIN HFA) 108 (90 Base) MCG/ACT inhaler Inhale 1-2 puffs into the lungs every 6 (six) hours as needed for wheezing or shortness of breath.    . Ascorbic Acid (VITAMIN C PO) Take by mouth.    . cetirizine (ZYRTEC) 10 MG tablet Take 10 mg by mouth as needed for allergies.    . naproxen sodium (ALEVE) 220 MG tablet Take 220 mg by mouth as needed.    . pseudoephedrine (SUDAFED) 30 MG tablet Take 30 mg by mouth every 4 (four) hours as needed for congestion.    . TURMERIC PO Take 1,000 mg by mouth.    Marland Kitchen VITAMIN D, CHOLECALCIFEROL, PO 4000 units 3 x per week    . XIIDRA 5 % SOLN Instill 1 drop into both eyes twice a day     No facility-administered medications  prior to visit.     Per HPI unless specifically indicated in ROS section below Review of Systems  Constitutional: Negative for fatigue and fever.  HENT: Negative for congestion.   Eyes: Negative for pain.  Respiratory: Negative for cough and shortness of breath.   Cardiovascular: Negative for chest pain, palpitations and leg swelling.  Gastrointestinal: Negative for abdominal pain.  Genitourinary: Negative for dysuria and vaginal bleeding.  Musculoskeletal: Negative for back pain.  Neurological: Negative for syncope, light-headedness and headaches.  Psychiatric/Behavioral: Negative for dysphoric mood.   Objective:  BP 140/90   Pulse 68   Temp 98 F (36.7 C) (Temporal)   Ht 5' 2.75" (1.594 m)   Wt 128 lb (58.1 kg)   LMP 09/23/2017 (Approximate) Comment:    SpO2 98%   BMI 22.86 kg/m   Wt Readings from Last 3 Encounters:  08/22/20 128 lb (58.1 kg)  05/27/20 134 lb 12 oz (61.1 kg)  04/11/19 128 lb (58.1 kg)      Physical Exam Constitutional:      General: She is not in acute distress.Vital signs are normal.     Appearance: Normal appearance. She is well-developed and well-nourished. She is not ill-appearing or toxic-appearing.  HENT:     Head: Normocephalic.  Right Ear: Hearing, tympanic membrane, ear canal and external ear normal.     Left Ear: Hearing, tympanic membrane, ear canal and external ear normal.     Nose: Nose normal.  Eyes:     General: Lids are normal. Lids are everted, no foreign bodies appreciated.     Extraocular Movements: EOM normal.     Conjunctiva/sclera: Conjunctivae normal.     Pupils: Pupils are equal, round, and reactive to light.  Neck:     Thyroid: No thyroid mass or thyromegaly.     Vascular: No carotid bruit.     Trachea: Trachea normal.  Cardiovascular:     Rate and Rhythm: Normal rate and regular rhythm.     Pulses: Intact distal pulses.     Heart sounds: Normal heart sounds, S1 normal and S2 normal. No murmur heard. No gallop.    Pulmonary:     Effort: Pulmonary effort is normal. No respiratory distress.     Breath sounds: Normal breath sounds. No wheezing, rhonchi or rales.  Chest:  Breasts: No discharge from either breast. No tenderness and bleeding.    Abdominal:     General: Bowel sounds are normal. There is no distension or abdominal bruit.     Palpations: Abdomen is soft. There is no fluid wave, hepatosplenomegaly or mass.     Tenderness: There is no abdominal tenderness. There is no CVA tenderness, guarding or rebound.     Hernia: No hernia is present.  Genitourinary:    Exam position: Supine.     Labia:        Right: No rash, tenderness or lesion.        Left: No rash, tenderness or lesion.      Vagina: Normal.     Cervix: No cervical motion tenderness, discharge or friability.     Uterus: Normal. Not enlarged and not tender.      Adnexa:        Right: No mass, tenderness or fullness.         Left: No mass, tenderness or fullness.    Musculoskeletal:     Cervical back: Normal range of motion and neck supple.  Lymphadenopathy:     Cervical: No cervical adenopathy.     Upper Body:  No axillary adenopathy present. Skin:    General: Skin is warm, dry and intact.     Findings: No rash.  Neurological:     Mental Status: She is alert.     Cranial Nerves: No cranial nerve deficit.     Sensory: No sensory deficit.     Deep Tendon Reflexes: Strength normal.  Psychiatric:        Mood and Affect: Mood is not anxious or depressed.        Speech: Speech normal.        Behavior: Behavior normal. Behavior is cooperative.        Cognition and Memory: Cognition and memory normal.        Judgment: Judgment normal.       Results for orders placed or performed in visit on 07/10/20  Novel Coronavirus, NAA ([facility])   Specimen: Nasopharyngeal(NP) swabs in vial transport medium   Nasopharynge  Screenin  Result Value Ref Range   SARS-CoV-2, NAA Not Detected Not Detected  SARS-COV-2, NAA 2 DAY TAT    Nasopharynge  Screenin  Result Value Ref Range   SARS-CoV-2, NAA 2 DAY TAT Performed     This visit occurred during the SARS-CoV-2 public health emergency.  Safety protocols were in place, including screening questions prior to the visit, additional usage of staff PPE, and extensive cleaning of exam room while observing appropriate contact time as indicated for disinfecting solutions.   COVID 19 screen:  No recent travel or known exposure to COVID19 The patient denies respiratory symptoms of COVID 19 at this time. The importance of social distancing was discussed today.   Assessment and Plan The patient's preventative maintenance and recommended screening tests for an annual wellness exam were reviewed in full today. Brought up to date unless services declined.  Counselled on the importance of diet, exercise, and its role in overall health and mortality. The patient's FH and SH was reviewed, including their home life, tobacco status, and drug and alcohol status.    Vaccines: COVID19 done ( will get records), Td, flu uptodate, S/P shingrix. Pap/DVE:  Due Mammo: 01/31/2020, q2 years. Sees Dr. Bary Castilla. Family history of breast cancer in mother. Bone Density: has had prednisone in past.. plan boned density in 2023 Colon: 02/01/2019 repeat in 10 years Smoking Status: former smoker ETOH/ drug use: none/none  Hep C: done  HIV screen:  refused    Problem List Items Addressed This Visit    High cholesterol (Chronic)    Due for re-eval.      Relevant Orders   Lipid panel   Comprehensive metabolic panel   Mild intermittent allergic asthma without complication (Chronic)    Refilled albuterol for rescue. Rare use.      Relevant Medications   albuterol (VENTOLIN HFA) 108 (90 Base) MCG/ACT inhaler   Vitamin B12 deficiency (Chronic)    Due for re-eval.  On supplement.      Relevant Orders   Vitamin B12   Vitamin D deficiency (Chronic)    Due for re-eval.   On supplement.       Relevant Orders   VITAMIN D 25 Hydroxy (Vit-D Deficiency, Fractures)   White coat syndrome without diagnosis of hypertension    BPs 120/70s consistently at home.       Other Visit Diagnoses    Routine general medical examination at a health care facility    -  Primary   Other fatigue       Relevant Orders   TSH   CBC with Differential/Platelet      Eliezer Lofts, MD

## 2020-08-22 NOTE — Assessment & Plan Note (Signed)
Refilled albuterol for rescue. Rare use.

## 2020-08-26 LAB — CYTOLOGY - PAP
Comment: NEGATIVE
Diagnosis: NEGATIVE
High risk HPV: NEGATIVE

## 2020-09-02 ENCOUNTER — Telehealth: Payer: Self-pay | Admitting: Family Medicine

## 2020-09-02 NOTE — Telephone Encounter (Signed)
It looks like not all her future lab orders were released.  I am assuming patient will need to return for fasting labs?  Please advise.

## 2020-09-02 NOTE — Telephone Encounter (Signed)
Patient states that she believes that she has not gotten all of her test results. She didn't see them on Mychart and wasn't given them. She is asking specifically about the triglycerides and cholesterol. Please advise. EM

## 2020-09-10 IMAGING — MG DIGITAL DIAGNOSTIC BILAT W/ TOMO W/ CAD
6 of 10 series · 6 of 30 positions shown · non-contrast
Comparison: Previous exam(s).

CLINICAL DATA: 58-year-old female presenting for evaluation of
focal pain in the upper-outer left breast. She has history of
excisional biopsy in the upper-outer left breast for ALH in 2602.

EXAM:
DIGITAL DIAGNOSTIC BILATERAL MAMMOGRAM WITH TOMO AND CAD; ULTRASOUND
LEFT BREAST LIMITED

[L TAN synth-2D]
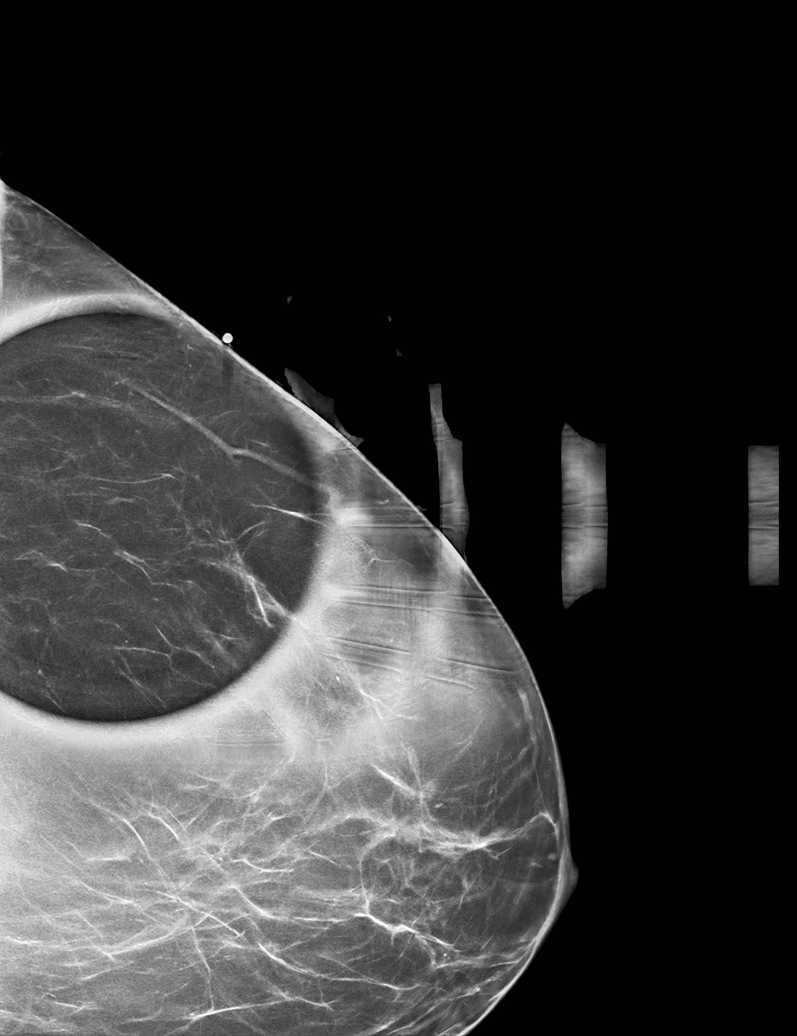

[L MLO synth-2D]
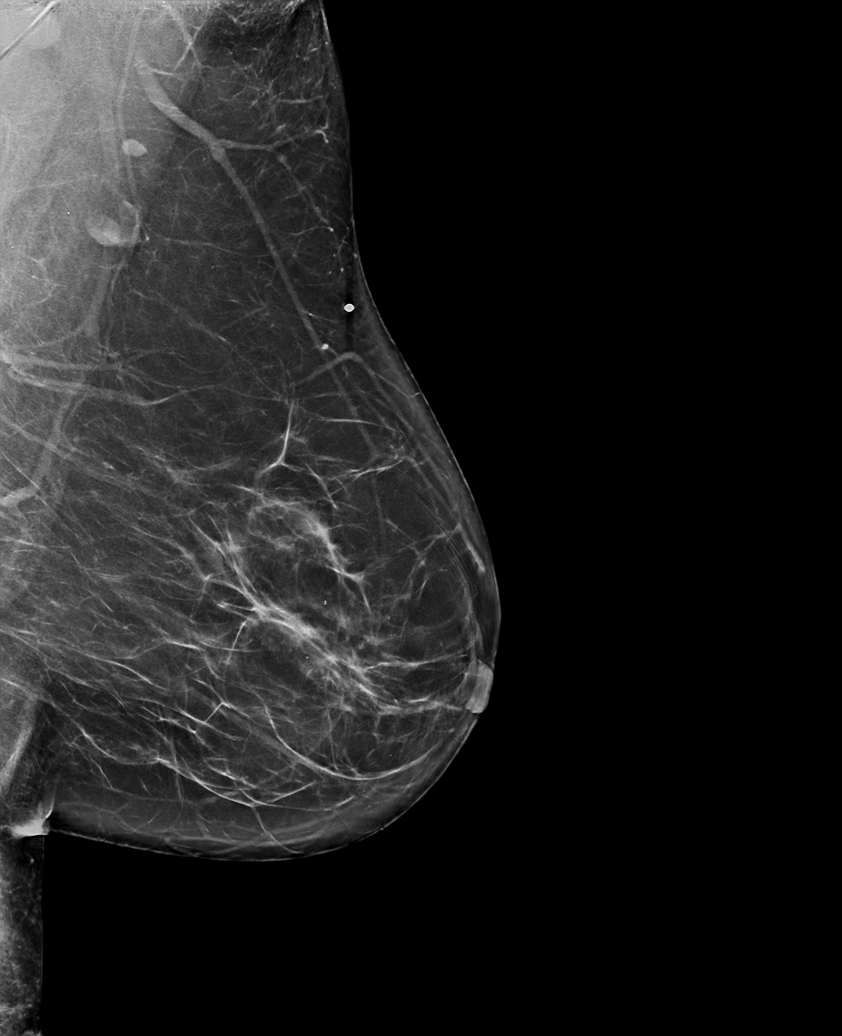

[R CC synth-2D]
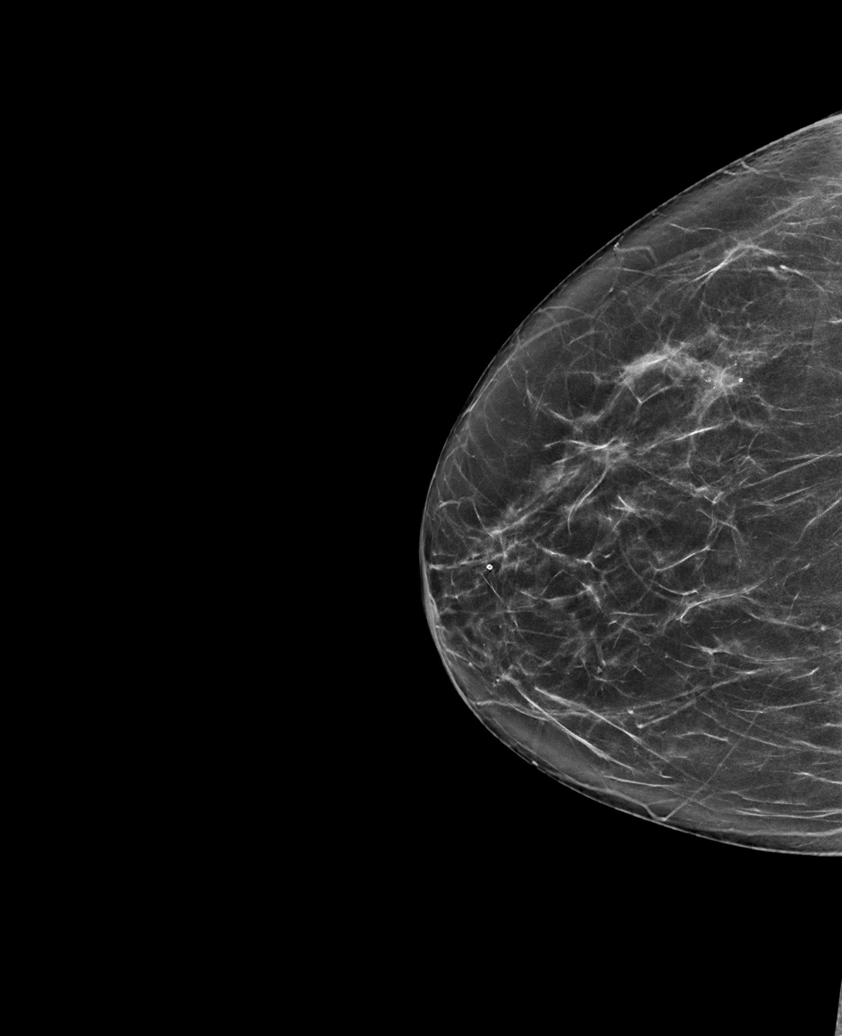

[R MLO synth-2D]
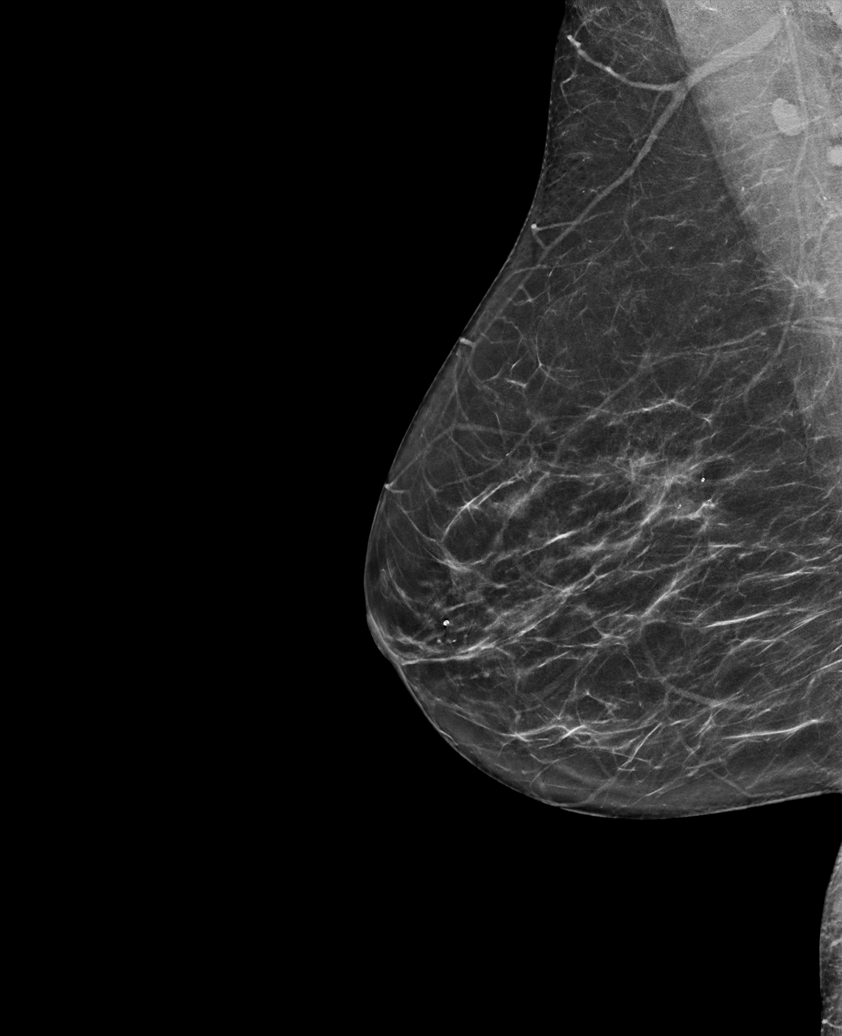

[L CC synth-2D]
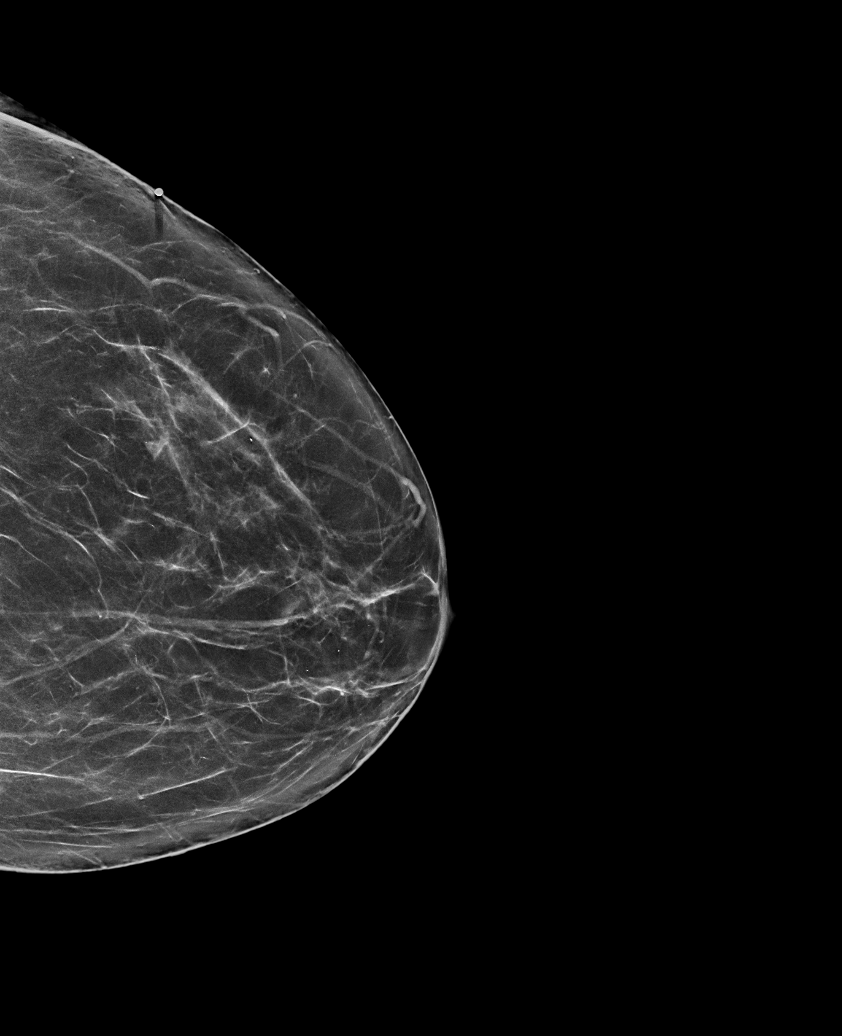

[L MLO tomo · tomo slice 43/84.0]
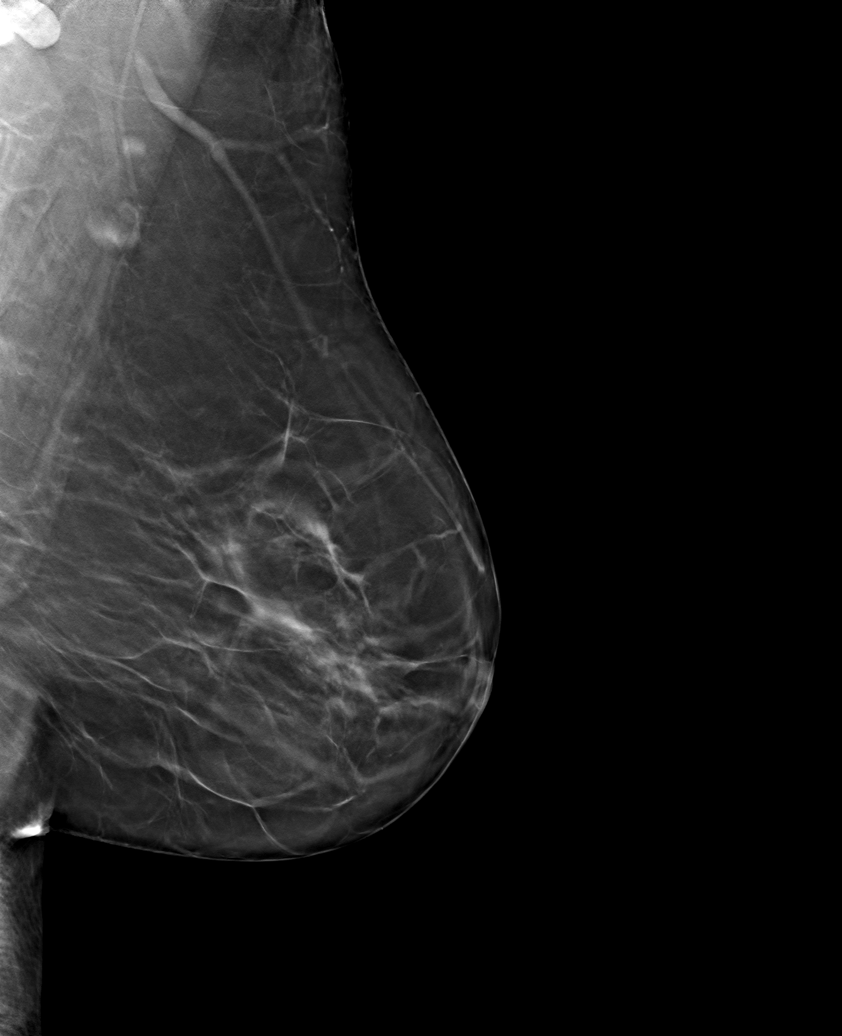

[6 of 30 positions shown; findings below may reference images not displayed]

ACR Breast Density Category b: There are scattered areas of
fibroglandular density.
FINDINGS: A BB has been placed at the site of focal pain in the upper-outer
quadrant of the left breast. The marker does correspond with the
region of the patient's prior surgical excision. No suspicious
changes are identified in the upper-outer left breast. No suspicious
calcifications, masses or areas of distortion are seen in the
bilateral breasts.

Mammographic images were processed with CAD.

Ultrasound targeted to the upper-outer quadrant of the left breast
demonstrates normal fibroglandular tissue. No suspicious masses or
areas of shadowing are identified.
IMPRESSION: 1. There are no mammographic or targeted sonographic abnormalities
in the upper outer left breast in the area of the patient's focal
pain.

2.  No mammographic evidence of malignancy in the bilateral breasts.

RECOMMENDATION:
1. Clinical follow-up recommended for the tender area of concern in
the upper-outer left breast. Any further workup should be based on
clinical grounds.

2.  Screening mammogram in one year.(Code:JY-F-CUF)

I have discussed the findings and recommendations with the patient.
If applicable, a reminder letter will be sent to the patient
regarding the next appointment.

BI-RADS CATEGORY  1: Negative.

## 2020-09-19 ENCOUNTER — Other Ambulatory Visit: Payer: Self-pay

## 2020-09-19 ENCOUNTER — Other Ambulatory Visit (INDEPENDENT_AMBULATORY_CARE_PROVIDER_SITE_OTHER): Payer: Self-pay

## 2020-09-19 DIAGNOSIS — E538 Deficiency of other specified B group vitamins: Secondary | ICD-10-CM

## 2020-09-19 DIAGNOSIS — E559 Vitamin D deficiency, unspecified: Secondary | ICD-10-CM

## 2020-09-19 DIAGNOSIS — E7800 Pure hypercholesterolemia, unspecified: Secondary | ICD-10-CM

## 2020-09-19 LAB — LIPID PANEL
Cholesterol: 250 mg/dL — ABNORMAL HIGH (ref 0–200)
HDL: 69.1 mg/dL (ref >39.00)
LDL Cholesterol: 165 mg/dL — ABNORMAL HIGH (ref 0–99)
NonHDL: 180.98
Total CHOL/HDL Ratio: 4
Triglycerides: 80 mg/dL (ref 0.0–149.0)
VLDL: 16 mg/dL (ref 0.0–40.0)

## 2020-09-19 LAB — COMPREHENSIVE METABOLIC PANEL
ALT: 23 U/L (ref 0–35)
AST: 19 U/L (ref 0–37)
Albumin: 4.4 g/dL (ref 3.5–5.2)
Alkaline Phosphatase: 70 U/L (ref 39–117)
BUN: 9 mg/dL (ref 6–23)
CO2: 30 mEq/L (ref 19–32)
Calcium: 9.7 mg/dL (ref 8.4–10.5)
Chloride: 104 mEq/L (ref 96–112)
Creatinine, Ser: 0.57 mg/dL (ref 0.40–1.20)
GFR: 99.45 mL/min (ref >60.00)
Glucose, Bld: 86 mg/dL (ref 70–99)
Potassium: 4.8 mEq/L (ref 3.5–5.1)
Sodium: 140 mEq/L (ref 135–145)
Total Bilirubin: 0.7 mg/dL (ref 0.2–1.2)
Total Protein: 6.9 g/dL (ref 6.0–8.3)

## 2020-09-19 LAB — VITAMIN B12: Vitamin B-12: 506 pg/mL (ref 211–911)

## 2020-09-19 LAB — VITAMIN D 25 HYDROXY (VIT D DEFICIENCY, FRACTURES): VITD: 78.16 ng/mL (ref 30.00–100.00)

## 2020-09-26 NOTE — Telephone Encounter (Signed)
Pt dropped off form and its in the prescription tower.

## 2020-11-26 ENCOUNTER — Telehealth: Payer: Self-pay

## 2020-11-26 NOTE — Telephone Encounter (Signed)
[place] and just tested + this morning with covid. Pt is quarantining, drinking plenty of fluids, resting and tylenol for fever. Pt said she does not have her inhaler; pt has tightness in throat and chest; SOB upon exertion. Pt is SOB on phone now but pt said just came up stairs; pt is also wheezing and does not feel can get a good breath. Pt is going to got to UC or ED [place] now. Pt will cb on 11/28/20 with update. Pt is not sure what day will return home. Sending FYI to Dr Diona Browner and Butch Penny CMA.

## 2020-11-27 NOTE — Telephone Encounter (Signed)
Noted. Agree pt needs to be seen. °

## 2021-02-05 ENCOUNTER — Telehealth: Payer: Self-pay | Admitting: Family Medicine

## 2021-02-05 DIAGNOSIS — E7800 Pure hypercholesterolemia, unspecified: Secondary | ICD-10-CM

## 2021-02-05 NOTE — Telephone Encounter (Signed)
-----   Message from Ellamae Sia sent at 01/27/2021  2:14 PM EDT ----- Regarding: Lab orders for Friday, 7.29.22 Lab orders, lipids? Thanks, T

## 2021-02-06 ENCOUNTER — Other Ambulatory Visit (INDEPENDENT_AMBULATORY_CARE_PROVIDER_SITE_OTHER): Payer: Self-pay

## 2021-02-06 ENCOUNTER — Other Ambulatory Visit: Payer: Self-pay

## 2021-02-06 DIAGNOSIS — E7800 Pure hypercholesterolemia, unspecified: Secondary | ICD-10-CM

## 2021-02-06 LAB — LIPID PANEL
Cholesterol: 240 mg/dL — ABNORMAL HIGH (ref 0–200)
HDL: 71.4 mg/dL (ref >39.00)
LDL Cholesterol: 152 mg/dL — ABNORMAL HIGH (ref 0–99)
NonHDL: 168.71
Total CHOL/HDL Ratio: 3
Triglycerides: 82 mg/dL (ref 0.0–149.0)
VLDL: 16.4 mg/dL (ref 0.0–40.0)

## 2021-02-06 LAB — COMPREHENSIVE METABOLIC PANEL
ALT: 15 U/L (ref 0–35)
AST: 16 U/L (ref 0–37)
Albumin: 4.5 g/dL (ref 3.5–5.2)
Alkaline Phosphatase: 54 U/L (ref 39–117)
BUN: 12 mg/dL (ref 6–23)
CO2: 30 mEq/L (ref 19–32)
Calcium: 9.8 mg/dL (ref 8.4–10.5)
Chloride: 104 mEq/L (ref 96–112)
Creatinine, Ser: 0.63 mg/dL (ref 0.40–1.20)
GFR: 96.82 mL/min (ref >60.00)
Glucose, Bld: 88 mg/dL (ref 70–99)
Potassium: 4.4 mEq/L (ref 3.5–5.1)
Sodium: 141 mEq/L (ref 135–145)
Total Bilirubin: 0.9 mg/dL (ref 0.2–1.2)
Total Protein: 7.1 g/dL (ref 6.0–8.3)

## 2021-08-21 DIAGNOSIS — L821 Other seborrheic keratosis: Secondary | ICD-10-CM | POA: Diagnosis not present

## 2021-08-21 DIAGNOSIS — L918 Other hypertrophic disorders of the skin: Secondary | ICD-10-CM | POA: Diagnosis not present

## 2021-10-23 ENCOUNTER — Other Ambulatory Visit: Payer: Self-pay | Admitting: Gastroenterology

## 2021-10-23 DIAGNOSIS — R1031 Right lower quadrant pain: Secondary | ICD-10-CM

## 2021-10-23 DIAGNOSIS — K654 Sclerosing mesenteritis: Secondary | ICD-10-CM

## 2021-10-29 ENCOUNTER — Ambulatory Visit
Admission: RE | Admit: 2021-10-29 | Discharge: 2021-10-29 | Disposition: A | Discharge status: Home / Self Care | Payer: 59 | Source: Ambulatory Visit | Attending: Gastroenterology | Admitting: Gastroenterology

## 2021-10-29 DIAGNOSIS — R1031 Right lower quadrant pain: Secondary | ICD-10-CM | POA: Diagnosis not present

## 2021-10-29 DIAGNOSIS — K654 Sclerosing mesenteritis: Secondary | ICD-10-CM | POA: Diagnosis not present

## 2021-10-29 MED ORDER — IOHEXOL 300 MG/ML  SOLN
85.0000 mL | Freq: Once | INTRAMUSCULAR | Status: AC | PRN
Start: 2021-10-29 — End: 2021-10-29
  Administered 2021-10-29: 85 mL via INTRAVENOUS

## 2021-11-02 ENCOUNTER — Other Ambulatory Visit: Payer: Self-pay | Admitting: Infectious Diseases

## 2021-11-02 DIAGNOSIS — E785 Hyperlipidemia, unspecified: Secondary | ICD-10-CM | POA: Diagnosis not present

## 2021-11-02 DIAGNOSIS — Z1231 Encounter for screening mammogram for malignant neoplasm of breast: Secondary | ICD-10-CM

## 2021-11-02 DIAGNOSIS — J4520 Mild intermittent asthma, uncomplicated: Secondary | ICD-10-CM | POA: Diagnosis not present

## 2021-11-02 DIAGNOSIS — Z0000 Encounter for general adult medical examination without abnormal findings: Secondary | ICD-10-CM | POA: Diagnosis not present

## 2021-11-02 DIAGNOSIS — Z7184 Encounter for health counseling related to travel: Secondary | ICD-10-CM | POA: Diagnosis not present

## 2021-11-02 DIAGNOSIS — R1031 Right lower quadrant pain: Secondary | ICD-10-CM | POA: Diagnosis not present

## 2021-11-10 ENCOUNTER — Other Ambulatory Visit: Payer: Self-pay

## 2021-11-13 ENCOUNTER — Encounter: Payer: Self-pay | Admitting: Family Medicine

## 2022-04-02 DIAGNOSIS — M3501 Sicca syndrome with keratoconjunctivitis: Secondary | ICD-10-CM | POA: Diagnosis not present

## 2022-06-09 IMAGING — CT CT ABD-PELV W/ CM
2 of 5 series · 16 of 46 positions shown, 18 images · IV contrast (agent unspecified)
Comparison: None.

CLINICAL DATA: Right lower quadrant pain, nausea

EXAM:
CT ABDOMEN AND PELVIS WITH CONTRAST
TECHNIQUE: Multidetector CT imaging of the abdomen and pelvis was performed
using the standard protocol following bolus administration of
intravenous contrast.

[Series 2: abd pelvis 5.00 · axial · 0.62mm/px · z∈[-1522,-1122]mm · 13 of 90 slices shown, 15 images]
[im 5/90  soft-tissue]
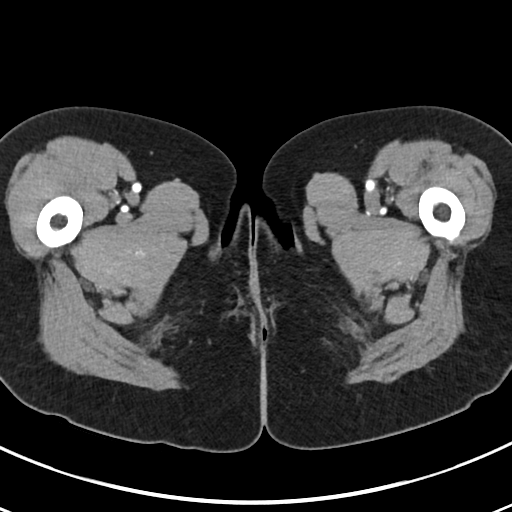
[im 5/90  bone]
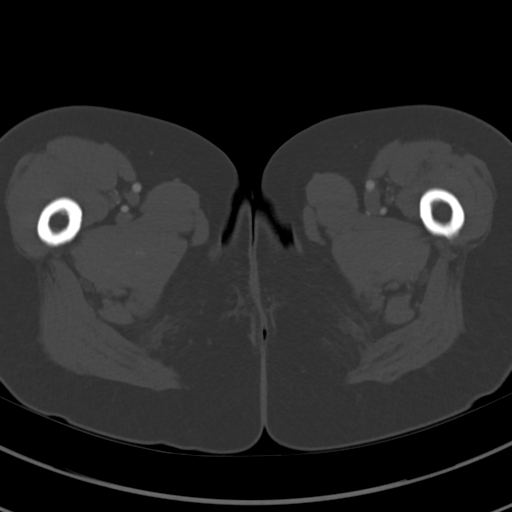
[im 10/90  soft-tissue]
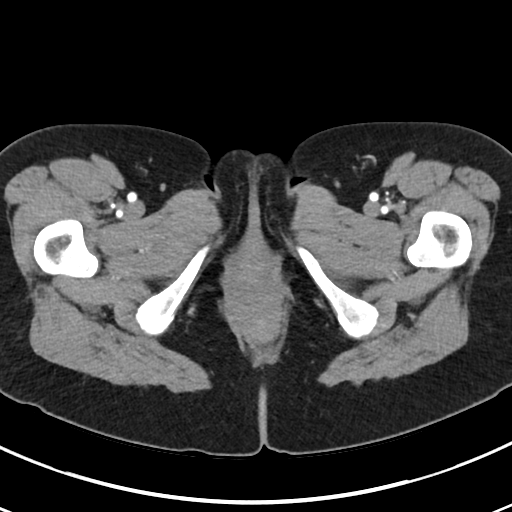
[im 20/90  soft-tissue]
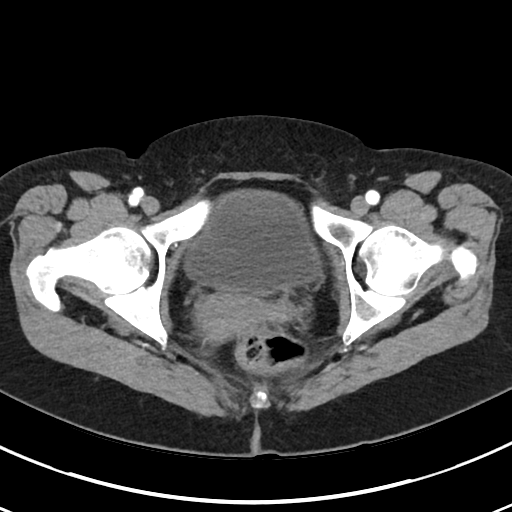
[im 25/90  soft-tissue]
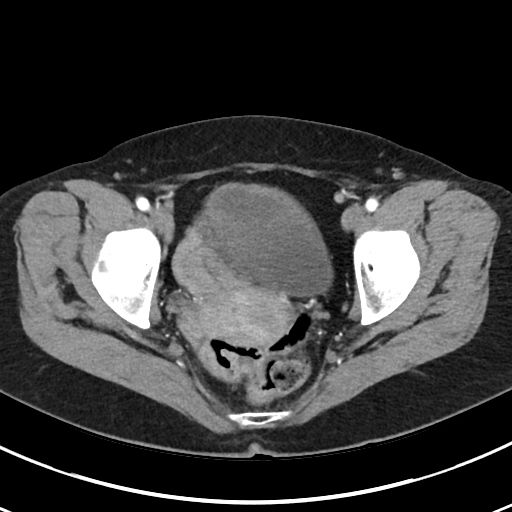
[im 30/90  soft-tissue]
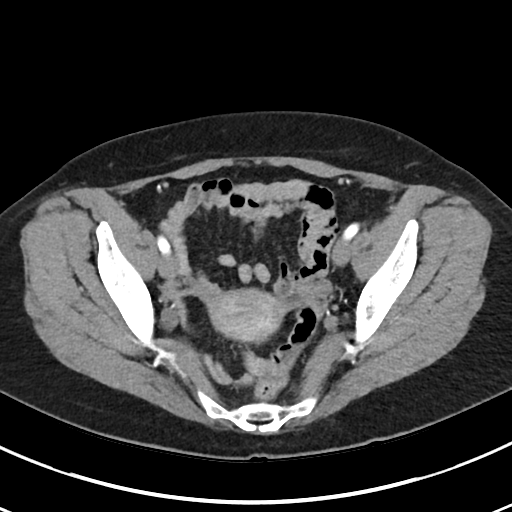
[im 40/90  soft-tissue]
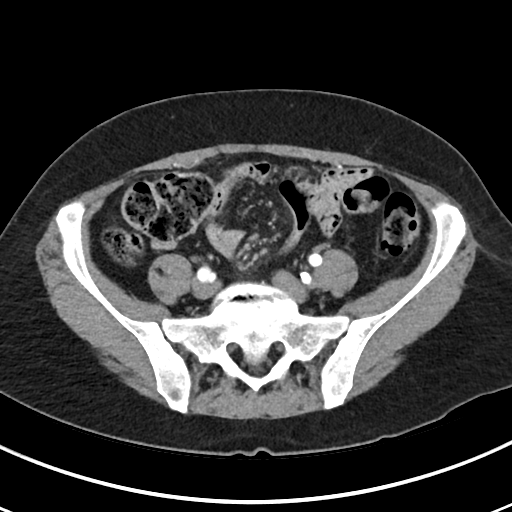
[im 45/90  soft-tissue]
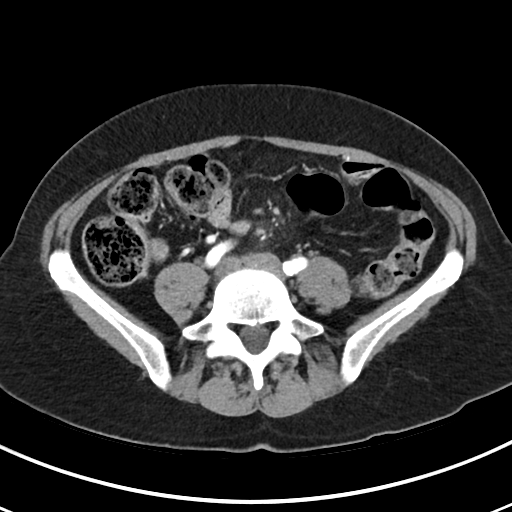
[im 50/90  soft-tissue]
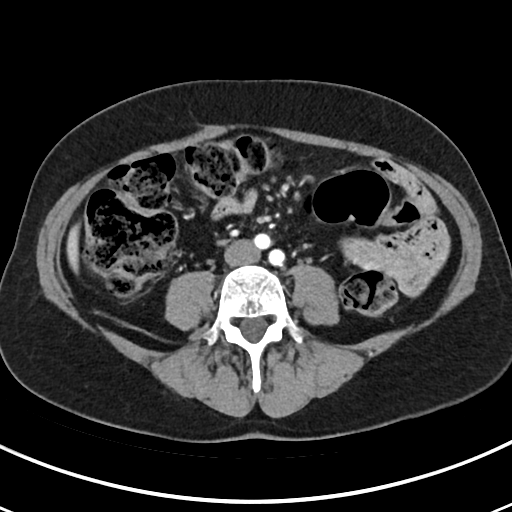
[im 60/90  soft-tissue]
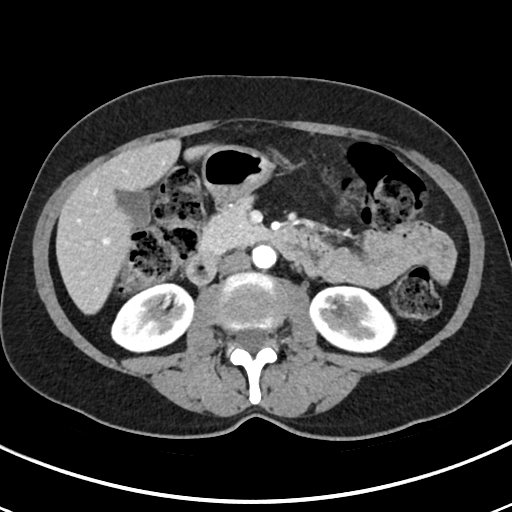
[im 60/90  bone]
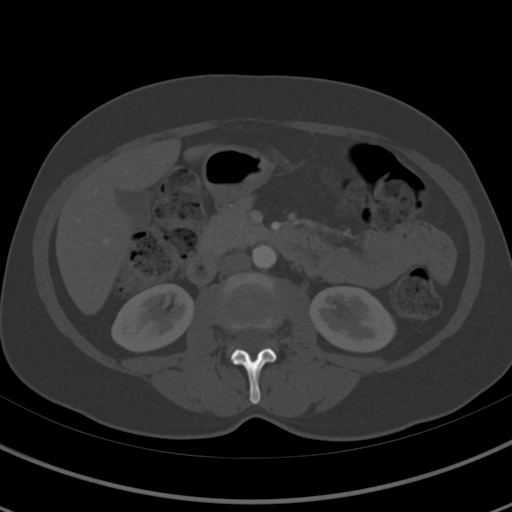
[im 65/90  soft-tissue]
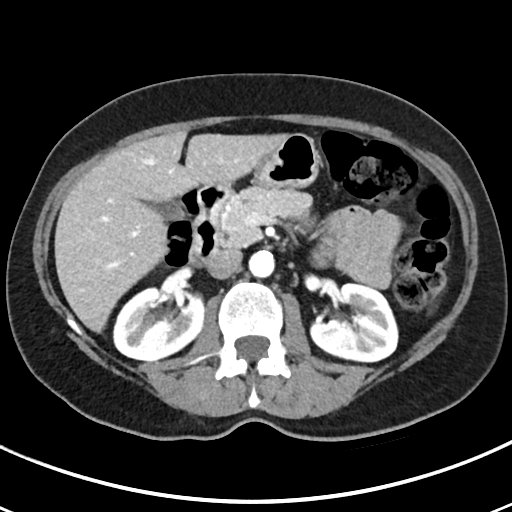
[im 70/90  soft-tissue]
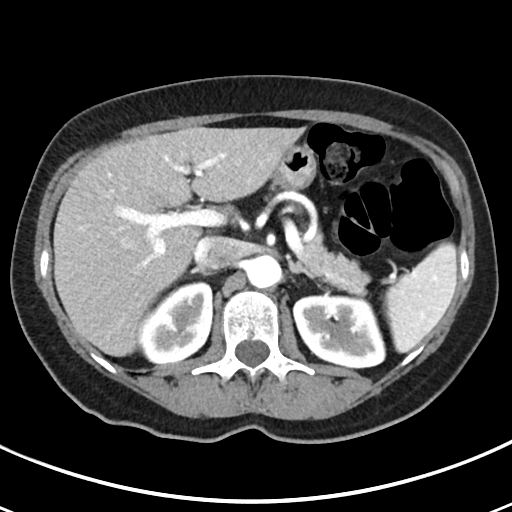
[im 80/90  soft-tissue]
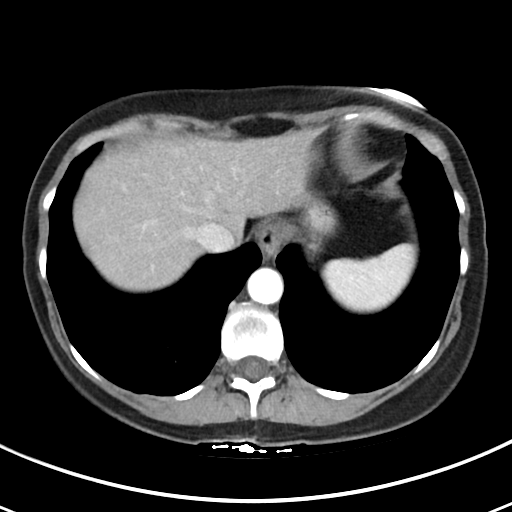
[im 85/90  soft-tissue]
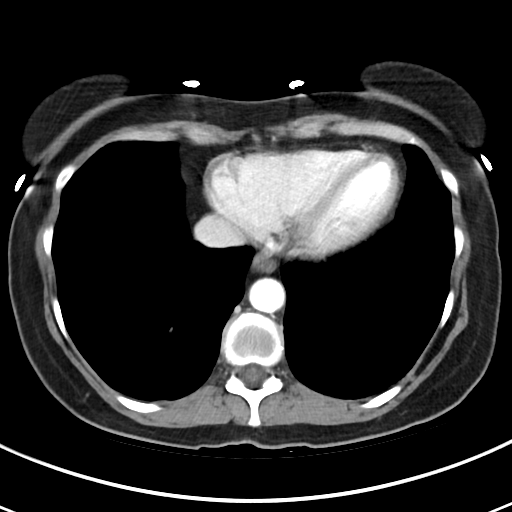

[Series 4: coronals abd pelvis 2.00 cor · coronal · 0.62mm/px · 3 of 120 slices shown]
[im 40/120  soft-tissue]
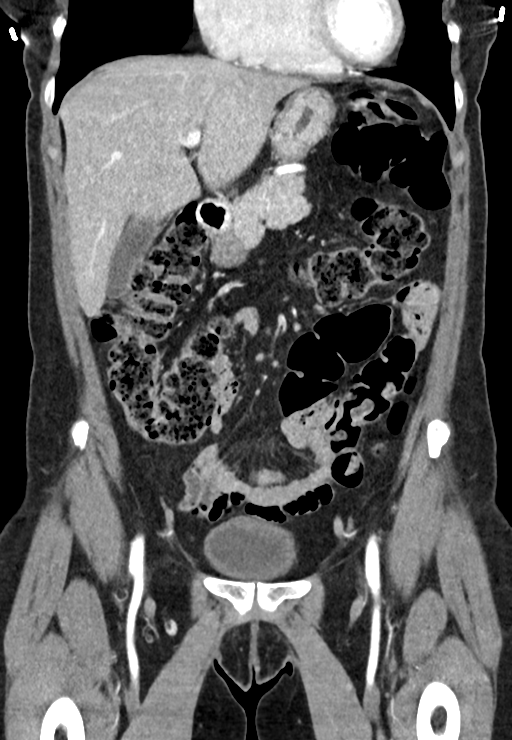
[im 53/120  soft-tissue]
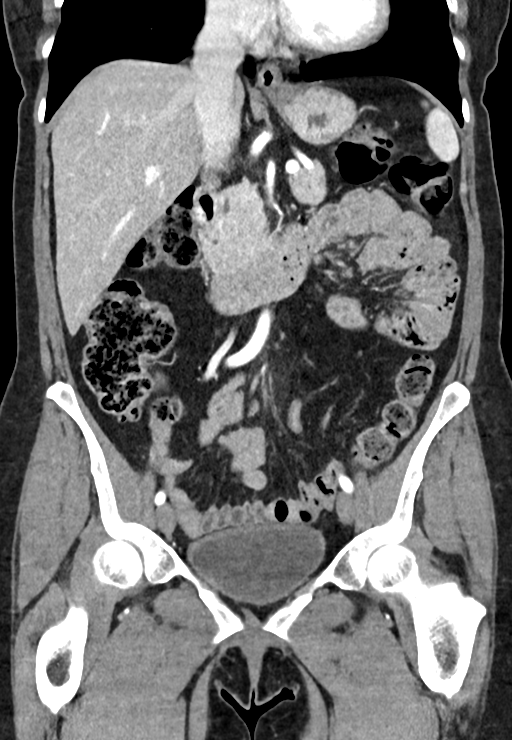
[im 67/120  soft-tissue]
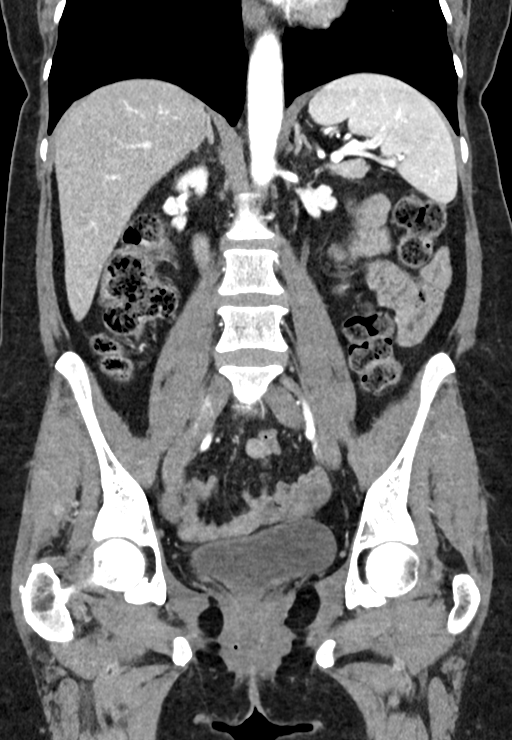

[16 of 46 positions shown; findings below may reference images not displayed]

RADIATION DOSE REDUCTION: This exam was performed according to the
departmental dose-optimization program which includes automated
exposure control, adjustment of the mA and/or kV according to
patient size and/or use of iterative reconstruction technique.

CONTRAST:  85mL OMNIPAQUE IOHEXOL 300 MG/ML  SOLN
FINDINGS: Lower chest: No acute abnormality

Hepatobiliary: No focal hepatic abnormality. Gallbladder
unremarkable.

Pancreas: No focal abnormality or ductal dilatation.

Spleen: No focal abnormality.  Normal size.

Adrenals/Urinary Tract: No adrenal abnormality. No focal renal
abnormality. No stones or hydronephrosis. Urinary bladder is
unremarkable.

Stomach/Bowel: Stomach, large and small bowel grossly unremarkable.
Moderate stool burden throughout the colon. Normal appendix.

Vascular/Lymphatic: Scattered calcifications. No evidence of
aneurysm or adenopathy.

Reproductive: Central fibroid in the uterus measures 2.7 cm. No
adnexal mass.

Other: No free fluid or free air.

Musculoskeletal: No acute bony abnormality.
IMPRESSION: Normal appendix.

No acute findings in the abdomen or pelvis.

Moderate stool burden.

Scattered aortic atherosclerosis.

Fibroid uterus.

## 2022-06-30 DIAGNOSIS — D2261 Melanocytic nevi of right upper limb, including shoulder: Secondary | ICD-10-CM | POA: Diagnosis not present

## 2022-06-30 DIAGNOSIS — X32XXXA Exposure to sunlight, initial encounter: Secondary | ICD-10-CM | POA: Diagnosis not present

## 2022-06-30 DIAGNOSIS — D2271 Melanocytic nevi of right lower limb, including hip: Secondary | ICD-10-CM | POA: Diagnosis not present

## 2022-06-30 DIAGNOSIS — D2262 Melanocytic nevi of left upper limb, including shoulder: Secondary | ICD-10-CM | POA: Diagnosis not present

## 2022-06-30 DIAGNOSIS — D225 Melanocytic nevi of trunk: Secondary | ICD-10-CM | POA: Diagnosis not present

## 2022-06-30 DIAGNOSIS — D2272 Melanocytic nevi of left lower limb, including hip: Secondary | ICD-10-CM | POA: Diagnosis not present

## 2022-06-30 DIAGNOSIS — L570 Actinic keratosis: Secondary | ICD-10-CM | POA: Diagnosis not present

## 2022-06-30 DIAGNOSIS — L910 Hypertrophic scar: Secondary | ICD-10-CM | POA: Diagnosis not present

## 2022-09-10 DIAGNOSIS — J0110 Acute frontal sinusitis, unspecified: Secondary | ICD-10-CM | POA: Diagnosis not present

## 2022-09-10 DIAGNOSIS — Z03818 Encounter for observation for suspected exposure to other biological agents ruled out: Secondary | ICD-10-CM | POA: Diagnosis not present

## 2022-09-10 DIAGNOSIS — H109 Unspecified conjunctivitis: Secondary | ICD-10-CM | POA: Diagnosis not present

## 2022-09-10 DIAGNOSIS — J101 Influenza due to other identified influenza virus with other respiratory manifestations: Secondary | ICD-10-CM | POA: Diagnosis not present

## 2022-09-10 DIAGNOSIS — J40 Bronchitis, not specified as acute or chronic: Secondary | ICD-10-CM | POA: Diagnosis not present

## 2022-09-10 DIAGNOSIS — R059 Cough, unspecified: Secondary | ICD-10-CM | POA: Diagnosis not present

## 2023-01-25 DIAGNOSIS — M9904 Segmental and somatic dysfunction of sacral region: Secondary | ICD-10-CM | POA: Diagnosis not present

## 2023-01-25 DIAGNOSIS — M9903 Segmental and somatic dysfunction of lumbar region: Secondary | ICD-10-CM | POA: Diagnosis not present

## 2023-01-25 DIAGNOSIS — M9902 Segmental and somatic dysfunction of thoracic region: Secondary | ICD-10-CM | POA: Diagnosis not present

## 2023-01-25 DIAGNOSIS — M9901 Segmental and somatic dysfunction of cervical region: Secondary | ICD-10-CM | POA: Diagnosis not present

## 2023-02-21 DIAGNOSIS — Z0000 Encounter for general adult medical examination without abnormal findings: Secondary | ICD-10-CM | POA: Diagnosis not present

## 2023-02-22 DIAGNOSIS — Z91018 Allergy to other foods: Secondary | ICD-10-CM | POA: Diagnosis not present

## 2023-02-22 DIAGNOSIS — E785 Hyperlipidemia, unspecified: Secondary | ICD-10-CM | POA: Diagnosis not present

## 2023-02-22 DIAGNOSIS — Z0000 Encounter for general adult medical examination without abnormal findings: Secondary | ICD-10-CM | POA: Diagnosis not present

## 2023-02-22 DIAGNOSIS — Z1231 Encounter for screening mammogram for malignant neoplasm of breast: Secondary | ICD-10-CM | POA: Diagnosis not present

## 2023-02-22 DIAGNOSIS — J4520 Mild intermittent asthma, uncomplicated: Secondary | ICD-10-CM | POA: Diagnosis not present

## 2023-02-23 ENCOUNTER — Other Ambulatory Visit: Payer: Self-pay | Admitting: Infectious Diseases

## 2023-02-23 DIAGNOSIS — Z0000 Encounter for general adult medical examination without abnormal findings: Secondary | ICD-10-CM

## 2023-02-23 DIAGNOSIS — E785 Hyperlipidemia, unspecified: Secondary | ICD-10-CM

## 2023-03-01 ENCOUNTER — Ambulatory Visit
Admission: RE | Admit: 2023-03-01 | Discharge: 2023-03-01 | Disposition: A | Discharge status: Home / Self Care | Payer: 59 | Source: Ambulatory Visit | Attending: Infectious Diseases | Admitting: Infectious Diseases

## 2023-03-01 DIAGNOSIS — Z0000 Encounter for general adult medical examination without abnormal findings: Secondary | ICD-10-CM | POA: Insufficient documentation

## 2023-03-01 DIAGNOSIS — E785 Hyperlipidemia, unspecified: Secondary | ICD-10-CM | POA: Insufficient documentation

## 2023-03-01 DIAGNOSIS — I2510 Atherosclerotic heart disease of native coronary artery without angina pectoris: Secondary | ICD-10-CM | POA: Diagnosis not present

## 2023-03-15 DIAGNOSIS — E538 Deficiency of other specified B group vitamins: Secondary | ICD-10-CM | POA: Diagnosis not present

## 2023-03-15 DIAGNOSIS — Z1151 Encounter for screening for human papillomavirus (HPV): Secondary | ICD-10-CM | POA: Diagnosis not present

## 2023-03-15 DIAGNOSIS — Z124 Encounter for screening for malignant neoplasm of cervix: Secondary | ICD-10-CM | POA: Diagnosis not present

## 2023-03-15 DIAGNOSIS — Z01411 Encounter for gynecological examination (general) (routine) with abnormal findings: Secondary | ICD-10-CM | POA: Diagnosis not present

## 2023-03-15 DIAGNOSIS — E559 Vitamin D deficiency, unspecified: Secondary | ICD-10-CM | POA: Diagnosis not present

## 2023-04-08 DIAGNOSIS — H3554 Dystrophies primarily involving the retinal pigment epithelium: Secondary | ICD-10-CM | POA: Diagnosis not present

## 2023-06-08 DIAGNOSIS — E785 Hyperlipidemia, unspecified: Secondary | ICD-10-CM | POA: Diagnosis not present
